# Patient Record
Sex: Female | Born: 1970
Health system: Southern US, Community
[De-identification: ages and names within clinical notes are randomized; demographics above are authoritative.]

## PROBLEM LIST (undated history)

## (undated) DIAGNOSIS — Z789 Other specified health status: Secondary | ICD-10-CM

## (undated) HISTORY — PX: HEMORROIDECTOMY: SUR656

---

## 2011-11-25 ENCOUNTER — Other Ambulatory Visit (HOSPITAL_COMMUNITY)
Admission: RE | Admit: 2011-11-25 | Discharge: 2011-11-25 | Disposition: A | Discharge status: Home / Self Care | Payer: BC Managed Care – PPO | Source: Ambulatory Visit | Attending: Obstetrics and Gynecology | Admitting: Obstetrics and Gynecology

## 2011-11-25 DIAGNOSIS — Z01419 Encounter for gynecological examination (general) (routine) without abnormal findings: Secondary | ICD-10-CM | POA: Insufficient documentation

## 2013-11-08 NOTE — L&D Delivery Note (Signed)
0100: In room to assess patient s/p 2 hours of pushing.  SVE with C/C/+2.  Patient on right side and assisted into supine position with elevation of head.  Patient encouraged to push and notable fetal descent noted. Patient reporting return of shoulder and neck pain, but encouraged to focus on pushing efforts.  FHR remained reassuring throughout pushing and patient delivered as below.   Delivery Note At 1:31 AM a viable female was delivered via VBAC, Spontaneous (Presentation: Right Occiput Anterior).  Shoulders delivered easily and infant with spontaneous cry.  Infant given tactile stimulation, by provider, and placed upon mothers abdomen.  Nurse continued tactile stimuation and infant APGAR: 9, 9; weight 8 lb 5.5 oz (3785 g).  Cord clamped and cut, cord blood collected.  Placenta delivered spontaneously and noted to be intact with 3VC.  Vaginal inspection noted 2nd degree laceration that was repaired.  Patient reporting some lightheadedness and blood pressure noted to be hypotensive.  Patient given IV bolus and PO intake with increase in blood pressure.  Infant in warmer and mother hemodynamically stable prior to provider exit.   Anesthesia: Epidural  Episiotomy: None Lacerations: 2nd degree Suture Repair: 3.0 vicryl Est. Blood Loss (mL): 400  Mom to postpartum.  Baby to Couplet care / Skin to Skin.  Revere Maahs Larita Fife, MSN, CNM 07/21/2014, 4:01 AM

## 2014-01-25 ENCOUNTER — Other Ambulatory Visit (HOSPITAL_COMMUNITY)
Admission: RE | Admit: 2014-01-25 | Discharge: 2014-01-25 | Disposition: A | Discharge status: Home / Self Care | Payer: BC Managed Care – PPO | Source: Ambulatory Visit | Attending: Obstetrics and Gynecology | Admitting: Obstetrics and Gynecology

## 2014-01-25 DIAGNOSIS — Z1151 Encounter for screening for human papillomavirus (HPV): Secondary | ICD-10-CM | POA: Insufficient documentation

## 2014-01-25 DIAGNOSIS — Z01419 Encounter for gynecological examination (general) (routine) without abnormal findings: Secondary | ICD-10-CM | POA: Insufficient documentation

## 2014-01-25 DIAGNOSIS — Z113 Encounter for screening for infections with a predominantly sexual mode of transmission: Secondary | ICD-10-CM | POA: Insufficient documentation

## 2014-07-02 LAB — OB RESULTS CONSOLE GBS: GBS: POSITIVE

## 2014-07-18 ENCOUNTER — Inpatient Hospital Stay (HOSPITAL_COMMUNITY)
Admission: AD | Admit: 2014-07-18 | Payer: BC Managed Care – PPO | Source: Ambulatory Visit | Admitting: Obstetrics and Gynecology

## 2014-07-20 ENCOUNTER — Inpatient Hospital Stay (HOSPITAL_COMMUNITY): Payer: Self-pay | Admitting: Anesthesiology

## 2014-07-20 ENCOUNTER — Encounter (HOSPITAL_COMMUNITY): Payer: Self-pay | Admitting: *Deleted

## 2014-07-20 ENCOUNTER — Inpatient Hospital Stay (HOSPITAL_COMMUNITY)
Admission: AD | Admit: 2014-07-20 | Discharge: 2014-07-23 | DRG: 775 | Disposition: A | Discharge status: Home / Self Care | Payer: Self-pay | Source: Ambulatory Visit | Attending: Obstetrics & Gynecology | Admitting: Obstetrics & Gynecology

## 2014-07-20 ENCOUNTER — Encounter (HOSPITAL_COMMUNITY): Payer: Self-pay | Admitting: Anesthesiology

## 2014-07-20 DIAGNOSIS — Z22330 Carrier of Group B streptococcus: Secondary | ICD-10-CM

## 2014-07-20 DIAGNOSIS — O9989 Other specified diseases and conditions complicating pregnancy, childbirth and the puerperium: Secondary | ICD-10-CM

## 2014-07-20 DIAGNOSIS — O4290 Premature rupture of membranes, unspecified as to length of time between rupture and onset of labor, unspecified weeks of gestation: Secondary | ICD-10-CM | POA: Diagnosis present

## 2014-07-20 DIAGNOSIS — O09529 Supervision of elderly multigravida, unspecified trimester: Secondary | ICD-10-CM | POA: Diagnosis present

## 2014-07-20 DIAGNOSIS — Z98891 History of uterine scar from previous surgery: Secondary | ICD-10-CM

## 2014-07-20 DIAGNOSIS — O99892 Other specified diseases and conditions complicating childbirth: Secondary | ICD-10-CM | POA: Diagnosis present

## 2014-07-20 DIAGNOSIS — O09523 Supervision of elderly multigravida, third trimester: Secondary | ICD-10-CM

## 2014-07-20 DIAGNOSIS — O34219 Maternal care for unspecified type scar from previous cesarean delivery: Secondary | ICD-10-CM | POA: Diagnosis present

## 2014-07-20 HISTORY — DX: Other specified health status: Z78.9

## 2014-07-20 LAB — CBC
HEMATOCRIT: 33.2 % — AB (ref 36.0–46.0)
Hemoglobin: 11.4 g/dL — ABNORMAL LOW (ref 12.0–15.0)
MCH: 33.9 pg (ref 26.0–34.0)
MCHC: 34.3 g/dL (ref 30.0–36.0)
MCV: 98.8 fL (ref 78.0–100.0)
PLATELETS: 135 10*3/uL — AB (ref 150–400)
RBC: 3.36 MIL/uL — ABNORMAL LOW (ref 3.87–5.11)
RDW: 13.2 % (ref 11.5–15.5)
WBC: 10.1 10*3/uL (ref 4.0–10.5)

## 2014-07-20 LAB — RPR

## 2014-07-20 LAB — OB RESULTS CONSOLE HIV ANTIBODY (ROUTINE TESTING): HIV: NONREACTIVE

## 2014-07-20 LAB — TYPE AND SCREEN
ABO/RH(D): B POS
Antibody Screen: NEGATIVE

## 2014-07-20 LAB — ABO/RH: ABO/RH(D): B POS

## 2014-07-20 LAB — POCT FERN TEST: POCT FERN TEST: POSITIVE

## 2014-07-20 LAB — HEPATITIS B SURFACE ANTIGEN: Hepatitis B Surface Ag: NEGATIVE

## 2014-07-20 LAB — RAPID HIV SCREEN (WH-MAU): SUDS RAPID HIV SCREEN: NONREACTIVE

## 2014-07-20 MED ORDER — LACTATED RINGERS IV SOLN: 500.0000 mL | Freq: Once | INTRAVENOUS | Status: DC

## 2014-07-20 MED ORDER — SODIUM BICARBONATE 8.4 % IV SOLN
INTRAVENOUS | Status: DC | PRN
  Administered 2014-07-20: 4 mL via EPIDURAL

## 2014-07-20 MED ORDER — LIDOCAINE HCL (PF) 1 % IJ SOLN
INTRAMUSCULAR | Status: DC | PRN
  Administered 2014-07-20: 10 mL

## 2014-07-20 MED ORDER — FENTANYL 2.5 MCG/ML BUPIVACAINE 1/10 % EPIDURAL INFUSION (WH - ANES)
14.0000 mL/h | INTRAMUSCULAR | Status: DC | PRN
  Administered 2014-07-20 (×2): 14 mL/h via EPIDURAL
  Filled 2014-07-20: qty 125

## 2014-07-20 MED ORDER — OXYTOCIN BOLUS FROM INFUSION
500.0000 mL | INTRAVENOUS | Status: DC
  Administered 2014-07-21: 500 mL via INTRAVENOUS

## 2014-07-20 MED ORDER — PHENYLEPHRINE 40 MCG/ML (10ML) SYRINGE FOR IV PUSH (FOR BLOOD PRESSURE SUPPORT)
80.0000 ug | PREFILLED_SYRINGE | INTRAVENOUS | Status: AC | PRN
  Administered 2014-07-20 (×3): 80 ug via INTRAVENOUS

## 2014-07-20 MED ORDER — FENTANYL 2.5 MCG/ML BUPIVACAINE 1/10 % EPIDURAL INFUSION (WH - ANES)
INTRAMUSCULAR | Status: AC
  Administered 2014-07-20: 14 mL/h via EPIDURAL
  Filled 2014-07-20: qty 125

## 2014-07-20 MED ORDER — DIPHENHYDRAMINE HCL 50 MG/ML IJ SOLN: 12.5000 mg | INTRAMUSCULAR | Status: DC | PRN

## 2014-07-20 MED ORDER — SODIUM CHLORIDE 0.9 % IJ SOLN
INTRAMUSCULAR | Status: AC
  Filled 2014-07-20: qty 100

## 2014-07-20 MED ORDER — OXYTOCIN 40 UNITS IN LACTATED RINGERS INFUSION - SIMPLE MED
1.0000 m[IU]/min | INTRAVENOUS | Status: DC
Start: 2014-07-20 — End: 2014-07-21

## 2014-07-20 MED ORDER — CITRIC ACID-SODIUM CITRATE 334-500 MG/5ML PO SOLN
30.0000 mL | ORAL | Status: DC | PRN
Start: 2014-07-20 — End: 2014-07-21
  Filled 2014-07-20: qty 15

## 2014-07-20 MED ORDER — PENICILLIN G POTASSIUM 5000000 UNITS IJ SOLR
2.5000 10*6.[IU] | INTRAVENOUS | Status: DC
  Administered 2014-07-20 – 2014-07-21 (×5): 2.5 10*6.[IU] via INTRAVENOUS
  Filled 2014-07-20 (×9): qty 2.5

## 2014-07-20 MED ORDER — OXYCODONE-ACETAMINOPHEN 5-325 MG PO TABS: 1.0000 | ORAL_TABLET | ORAL | Status: DC | PRN

## 2014-07-20 MED ORDER — OXYCODONE-ACETAMINOPHEN 5-325 MG PO TABS: 2.0000 | ORAL_TABLET | ORAL | Status: DC | PRN

## 2014-07-20 MED ORDER — LACTATED RINGERS IV SOLN
INTRAVENOUS | Status: DC
  Administered 2014-07-20 (×2): via INTRAUTERINE

## 2014-07-20 MED ORDER — BUPIVACAINE HCL (PF) 0.25 % IJ SOLN
INTRAMUSCULAR | Status: AC
  Filled 2014-07-20: qty 30

## 2014-07-20 MED ORDER — VASOPRESSIN 20 UNIT/ML IJ SOLN
INTRAMUSCULAR | Status: AC
  Filled 2014-07-20: qty 1

## 2014-07-20 MED ORDER — PENICILLIN G POTASSIUM 5000000 UNITS IJ SOLR
5.0000 10*6.[IU] | Freq: Once | INTRAMUSCULAR | Status: AC
  Administered 2014-07-20: 5 10*6.[IU] via INTRAVENOUS
  Filled 2014-07-20: qty 5

## 2014-07-20 MED ORDER — NALBUPHINE HCL 10 MG/ML IJ SOLN
10.0000 mg | INTRAMUSCULAR | Status: DC | PRN
  Administered 2014-07-20: 10 mg via INTRAVENOUS
  Filled 2014-07-20: qty 1

## 2014-07-20 MED ORDER — OXYTOCIN 40 UNITS IN LACTATED RINGERS INFUSION - SIMPLE MED: 62.5000 mL/h | INTRAVENOUS | Status: DC

## 2014-07-20 MED ORDER — EPHEDRINE 5 MG/ML INJ
10.0000 mg | INTRAVENOUS | Status: DC | PRN
  Filled 2014-07-20: qty 2

## 2014-07-20 MED ORDER — PHENYLEPHRINE 40 MCG/ML (10ML) SYRINGE FOR IV PUSH (FOR BLOOD PRESSURE SUPPORT)
80.0000 ug | PREFILLED_SYRINGE | INTRAVENOUS | Status: DC | PRN
  Administered 2014-07-20 (×2): 80 ug via INTRAVENOUS
  Filled 2014-07-20: qty 10
  Filled 2014-07-20: qty 2

## 2014-07-20 MED ORDER — ONDANSETRON HCL 4 MG/2ML IJ SOLN
4.0000 mg | Freq: Four times a day (QID) | INTRAMUSCULAR | Status: DC | PRN
Start: 2014-07-20 — End: 2014-07-21
  Administered 2014-07-20: 4 mg via INTRAVENOUS
  Filled 2014-07-20: qty 2

## 2014-07-20 MED ORDER — ACETAMINOPHEN 10 MG/ML IV SOLN
1000.0000 mg | Freq: Once | INTRAVENOUS | Status: AC
  Administered 2014-07-20: 1000 mg via INTRAVENOUS
  Filled 2014-07-20: qty 100

## 2014-07-20 MED ORDER — PHENYLEPHRINE 40 MCG/ML (10ML) SYRINGE FOR IV PUSH (FOR BLOOD PRESSURE SUPPORT)
PREFILLED_SYRINGE | INTRAVENOUS | Status: AC
  Administered 2014-07-20: 80 ug
  Filled 2014-07-20: qty 10

## 2014-07-20 MED ORDER — TERBUTALINE SULFATE 1 MG/ML IJ SOLN: 0.2500 mg | Freq: Once | INTRAMUSCULAR | Status: AC | PRN

## 2014-07-20 MED ORDER — LACTATED RINGERS IV SOLN
INTRAVENOUS | Status: DC
  Administered 2014-07-20 (×3): via INTRAVENOUS

## 2014-07-20 MED ORDER — LACTATED RINGERS IV SOLN
500.0000 mL | INTRAVENOUS | Status: DC | PRN
  Administered 2014-07-20 – 2014-07-21 (×4): 500 mL via INTRAVENOUS

## 2014-07-20 MED ORDER — LIDOCAINE HCL (PF) 1 % IJ SOLN
30.0000 mL | INTRAMUSCULAR | Status: DC | PRN
Start: 2014-07-20 — End: 2014-07-21
  Filled 2014-07-20: qty 30

## 2014-07-20 MED ORDER — OXYTOCIN 40 UNITS IN LACTATED RINGERS INFUSION - SIMPLE MED
1.0000 m[IU]/min | INTRAVENOUS | Status: DC
  Administered 2014-07-20: 1 m[IU]/min via INTRAVENOUS
  Filled 2014-07-20: qty 1000

## 2014-07-20 MED ORDER — ACETAMINOPHEN 325 MG PO TABS: 650.0000 mg | ORAL_TABLET | ORAL | Status: DC | PRN

## 2014-07-20 MED ORDER — FENTANYL 2.5 MCG/ML BUPIVACAINE 1/10 % EPIDURAL INFUSION (WH - ANES): 14.0000 mL/h | INTRAMUSCULAR | Status: DC | PRN

## 2014-07-20 NOTE — Progress Notes (Signed)
  Subjective: Comfortable with epidural.    Objective: BP 87/54  Pulse 116  Temp(Src) 98.6 F (37 C) (Oral)  Resp 18  Ht  (1.626 m)  Wt 162 lb (73.483 kg)  BMI 27.79 kg/m2  SpO2 96%      FHT: Category 2--moderate variability, persistent mild variables with UCs UC:   regular, every 3 minutes SVE:   7-8, with edematous anterior lip, vtx, +1 IUPC placed without difficulty.  Pitocin at 8 mu/min  Assessment:  Active labor Previous C/S, subsequent VBAC GBS positive Inadequate labor Category 2 FHR, variable decels.  Plan: Amnioinfusion Position change to facilitate rotation/descent. Augment to establish/maintain adequacy.  Nigel Bridgeman CNM 07/20/2014, 3:14 PM

## 2014-07-20 NOTE — Progress Notes (Signed)
  Subjective: Comfortable now with epidural.  Sleeping at present.  Objective: BP 122/69  Pulse 89  Temp(Src) 98.3 F (36.8 C) (Oral)  Resp 18  Ht  (1.626 m)  Wt 162 lb (73.483 kg)  BMI 27.79 kg/m2  SpO2 96%      FHT: Category 2--moderate variability, occasional mild variables. UC:   regular, every 5 minutes SVE:   Dilation: 6 Effacement (%): 80 Station: -1;0 Exam by:: J.Thornton, RN at 1151 Pitocin at 6 mu/min   Assessment:  Progressive labor GBS positive Previous C/S, subsequent VBAC  Plan: Continue current care. Continue augmentation to establish adequacy.  Nigel Bridgeman CNM 07/20/2014, 1:49 PM

## 2014-07-20 NOTE — Progress Notes (Signed)
  Subjective: Crying out with contractions.  Husband at bedside, wants to discuss epidural.  Objective: BP 120/75  Pulse 81  Temp(Src) 98.7 F (37.1 C) (Oral)  Resp 20  Ht  (1.626 m)  Wt 162 lb (73.483 kg)  BMI 27.79 kg/m2      FHT: Category 1 UC:   regular, every 3 minutes SVE:   Dilation: 2.5 Effacement (%): 80 Station: -3 Exam by:: Gerrit Heck, CNM Outlet very tight, patient very uncomfortable with exam Pitocin at 3 mu/min  Assessment:  Early labor Previous C/S, subsequent VBAC, desires VBAC GBS positive  Plan: Reviewed questions about epidural--patient agrees to proceed with placement, with appropriate covering/modesty considerations incorporated.  Nigel Bridgeman CNM 07/20/2014, 9:22 AM

## 2014-07-20 NOTE — Progress Notes (Signed)
Angel Olsen MRN: [id number]  Subjective: -Patient pushing x 1 hour.  Pushing efforts adequate.  Patient denies fatigue.    Objective: BP 131/71  Pulse 69  Temp(Src) 98.2 F (36.8 C) (Oral)  Resp 18  Ht  (1.626 m)  Wt 162 lb (73.483 kg)  BMI 27.79 kg/m2  SpO2 95%   Total I/O In: -  Out: 1000 [Urine:1000] FHT:  135 bpm, Mod Var, + Late Decels, +Accels UC:   Q2-31min, MVUs 205 mmHg SVE:   Dilation: 10 Effacement (%): 100 Station: +1 Exam by:: [name] CNM Membranes:AROM x 24hrs Pitocin: 36mUn/min IUPC in place Foley Catheter in place, draining clear yellow urine O2 via Face Mask Epidural in place, infusing  Assessment:  IUP at 39.3wks Cat II FT GBS Positive 2nd Stage Labor Pushing x 1 hour Asynclitic Presentation-Questionable LOT  Plan: -Continue to pushing efforts  -Continue O2 and IV fluid bolus  -Continue other mgmt as ordered  [name],CNM, MSN 07/20/2014, 11:53 PM

## 2014-07-20 NOTE — Anesthesia Procedure Notes (Signed)

## 2014-07-20 NOTE — Progress Notes (Signed)
  Subjective: Comfortable with epidural.    Objective: BP 120/71  Pulse 87  Temp(Src) 98.6 F (37 C) (Oral)  Resp 18  Ht  (1.626 m)  Wt 162 lb (73.483 kg)  BMI 27.79 kg/m2  SpO2 96%      FHT: Category 2--sporadic late decels and variables, responsive to position change.  Moderate variability. UC:   regular, every 4 minutes SVE:   Dilation: Lip/rim Effacement (%): 100 Station: +1 Exam by:: Tianna Baus cnm Pitocin at 8 mu/min MVUs 170  Assessment:  Progressive labor Category 2 FHR  Plan: Continue augmentation to adequacy. Close observation of FHR.  Nigel Bridgeman CNM 07/20/2014, 5:18 PM

## 2014-07-20 NOTE — MAU Note (Signed)
At 0015 a lot of water came out. Light pink in color. Having some contractions

## 2014-07-20 NOTE — Progress Notes (Signed)
Batoul Limes MRN: [id number]  Subjective: -Patient coping well with contractions.    Objective: BP 122/74  Pulse 76  Temp(Src) 98.7 F (37.1 C) (Oral)  Resp 18  Ht  (1.626 m)  Wt 162 lb 12.8 oz (73.846 kg)  BMI 27.93 kg/m2     FHT:  135 bpm, Mod Var, -Decels, +Accels UC:   Q 2-70min, palpates mild SVE:   Dilation: FT Effacement (%): 50 Station: -3 Exam by:: Sabas Sous, CNM Membranes:SROM x 6hrs Pitocin:25mUn/min BS [place]: Vertex confirmed  Assessment:  IUP at 39.2wks Cat I FT  SROM Pitocin Augmentation  Plan: -Discussed fetal position  -Informed that physician will assume care this am -No questions or concerns -Continue other mgmt as ordered  [facility], Orell Hurtado LYNN,CNM, MSN 07/20/2014, 6:29 AM

## 2014-07-20 NOTE — Anesthesia Preprocedure Evaluation (Signed)
Anesthesia Evaluation  Patient identified by MRN, date of birth, ID band Patient awake    Reviewed: Allergy & Precautions, H&P , Patient's Chart, lab work & pertinent test results  Airway Mallampati: II TM Distance: >3 FB Neck ROM: full    Dental  (+) Teeth Intact   Pulmonary  breath sounds clear to auscultation        Cardiovascular Rhythm:regular Rate:Normal     Neuro/Psych    GI/Hepatic   Endo/Other    Renal/GU      Musculoskeletal   Abdominal   Peds  Hematology   Anesthesia Other Findings    TOLAC, successful VB after CS already   Reproductive/Obstetrics (+) Pregnancy                           Anesthesia Physical Anesthesia Plan  ASA: II  Anesthesia Plan: Epidural   Post-op Pain Management:    Induction:   Airway Management Planned:   Additional Equipment:   Intra-op Plan:   Post-operative Plan:   Informed Consent: I have reviewed the patients History and Physical, chart, labs and discussed the procedure including the risks, benefits and alternatives for the proposed anesthesia with the patient or authorized representative who has indicated his/her understanding and acceptance.   Dental Advisory Given  Plan Discussed with:   Anesthesia Plan Comments: (Labs checked- platelets confirmed with RN in room. Fetal heart tracing, per RN, reported to be stable enough for sitting procedure. Discussed epidural, and patient consents to the procedure:  included risk of possible headache,backache, failed block, allergic reaction, and nerve injury. This patient was asked if she had any questions or concerns before the procedure started.)        Anesthesia Quick Evaluation

## 2014-07-20 NOTE — Progress Notes (Signed)
Raffaela Ladley MRN: [id number]  Subjective: -Nurse call stating patient experiencing increased abdominal pain and discomfort, despite 2 PCA doses.  Patient breathing through contractions.    Objective: BP 114/79  Pulse 84  Temp(Src) 98.8 F (37.1 C) (Oral)  Resp 18  Ht  (1.626 m)  Wt 162 lb (73.483 kg)  BMI 27.79 kg/m2  SpO2 95%     FHT:  135 bpm, Mod Var, -Decels, +Accels UC:Q2-24min, palpates moderate, MVUs   SVE:   Lip/Rim, -2 Membranes:AROM x 21hrs Pitocin:78mUn/min Foley Catheter in Place Epidural in place, infusing  Assessment:  IUP at 39.2wks Cat I FT  Negative change in cervical exam  Plan: -Dr. Audree Camel consulted and patient case discussed -In room to discuss options with patient including c/s -Patient requesting time to discuss options with husband -Continue other mgmt as ordered  Laren Boom, MSN 07/20/2014, 10:39 PM

## 2014-07-20 NOTE — Progress Notes (Signed)
Jackie Russman MRN: [id number]  Subjective: -In room for late deceleration. Patient tearful and reporting intense pain in left shoulder and neck.    Objective: BP 100/53  Pulse 75  Temp(Src) 98.6 F (37 C) (Oral)  Resp 18  Ht  (1.626 m)  Wt 162 lb (73.483 kg)  BMI 27.79 kg/m2  SpO2 95%     FHT: 150 bpm, Mod Var, + Late Decels, +Accels UC: Q , MVUs   SVE:   Dilation: 10 Effacement (%): 100 Station: -1 Exam by:: V.Latham, CNM Membranes: SROM x 18hrs Pitocin:80mUn/min Epidural in place Ice pack on left shoulder  Assessment:  IUP at 39.2wks Cat II FT GBS Positive Referred Pain  Plan: -Anesthesia contacted regarding shoulder pain, instructed to turn off epidural and IV tylenol -Position change to assist with shoulder pain and Cat II FT -O2 and IV bolus  -Continue other mgmt as ordered -Provider to remain at bedside  Laren Boom, MSN 07/20/2014, 7:47 PM

## 2014-07-20 NOTE — Progress Notes (Signed)
Angel Olsen MRN: [id number]  Subjective: -Patient reports resolution of neck/shoulder pain with ice pack and IV tylenol.  Patient now feeling discomfort with contractions.   Objective: BP 109/59  Pulse 97  Temp(Src) 98.8 F (37.1 C) (Oral)  Resp 20  Ht  (1.626 m)  Wt 162 lb (73.483 kg)  BMI 27.79 kg/m2  SpO2 95%     FHT: 140 bpm, Mod Var, -Decels, +Accels UC:   Q2-41min, palpates moderate, MVUs adequate SVE:   Dilation: 10 Effacement (%): 100 Station: +1 Exam by:: Adreena Willits CNM Membranes: AROM x 20hrs Pitocin:44mUn/min Epidural in place, infusion paused  Assessment:  IUP at 39.2wks Cat I FT  2nd Stage Labor Inadequate Pushing Efforts  Plan: -Pushing efforts inadequate -Patient placed in high fowlers to labor down -Will continue to monitor -Continue other mgmt as ordered -Anticipate SVD  Angel Olsen LYNN,CNM, MSN 07/20/2014, 8:36 PM

## 2014-07-20 NOTE — Progress Notes (Addendum)
  Subjective: Breathing with UCs.  Husband and mother at bedside.  Received Nubain at 0722--declining epidural due to desire to avoid a female provider.  No epidural with previous vaginal delivery.  Husband reports 1st labor progressed to 7 cm, and he felt C/S was done due to "greed" on the part of the doctors/hospital in [place].  Patient listed under two names in EPIC: Kyren Alafaran (DOB 01-25-1971) Retta Marciano (DOB [birth year])  I found prenatal records on this patient, entered under the following name/birthdate in EPIC: Tanyia, Grabbe DOB [birth year] Address:  [address] [place], [place], [place] [address] Different SSN # (but may be dummy numbers) due to patient not being American citizen. Different insurance information Same phone numbers and email Same emergency contact/spouse  Same patient listed in EPIC asMaya, Arcand DOB 1971/05/06 [address] [place], [place] [address]   Objective: BP 120/75  Pulse 81  Temp(Src) 98.7 F (37.1 C) (Oral)  Resp 20  Ht  (1.626 m)  Wt 162 lb (73.483 kg)  BMI 27.79 kg/m2      FHT: Category 1 UC:   regular, every 2-3 minutes SVE:  Deferred at present--last exam at 0557, 1 cm, 40%, vtx, -3  Pitocin at 3 mu/min  Assessment:  Latent/early labor PROM at term x 7 hrs. GBS positive Previous C/S, subsequent VBAC, desires VBAC  Plan: Continue current care. Discussed issue of epidural and desire to avoid a female provider--husband advises they may consider epidural depending on progress in labor and patient tolerance of pain.  Support to patient for any decision she prefers. Will confirm correct demographic data on patient. Will inform medical records and MAU of situation.  Patient and husband confirm information on prenatal record is correct: Dariyah Sigl DOB [birth year] [address] [place], [place] [address]  Nigel Bridgeman CNM 07/20/2014, 8:16 AM  Addendum:  PN Labs from [facility] Records: B+, neg antibody, RPR NR, HIV NR, Hgb  electrophoresis negative, TSH WNL, GDM screen WNL, Hgb 10.9 at NOB Rubella and HBsAG not listed.  HBsAG NR per admission labs. Rubella screen in process.  Nigel Bridgeman, CNM 07/20/14 8:30a

## 2014-07-20 NOTE — H&P (Signed)
Angel Olsen is a 43 y.o. female presenting for SROM.  Patient states that her water broke at 0015.  Patient states that she was having some mild irregular contractions earlier in the day, but none prior to SROM. Patient reports active fetus and denies VB and ctx.     Maternal Medical History:  Reason for admission: Rupture of membranes.   Contractions: Onset was 1-2 hours ago.   Frequency: rare.   Perceived severity is mild.    Fetal activity: Perceived fetal activity is normal.   Last perceived fetal movement was within the past hour.    Prenatal complications: no prenatal complications Prenatal Complications - Diabetes: none.   Patient with history of cesarean section in 2007 Successful VBAC in 2011 Previous pregnancies and deliveries occurred in [place] OB History   Grav Para Term Preterm Abortions TAB SAB Ect Mult Living   Past Medical History  Diagnosis Date  . Medical history non-contributory    Past Surgical History  Procedure Laterality Date  . Cesarean section    . Hemorroidectomy     Family History: family history is not on file. Social History:  reports that she has never smoked. She does not have any smokeless tobacco history on file. She reports that she does not drink alcohol or use illicit drugs.   Prenatal Transfer Tool  Maternal Diabetes: No Genetic Screening: UNKNOWN Maternal Ultrasounds/Referrals: Normal Fetal Ultrasounds or other Referrals:  None Maternal Substance Abuse:  No Significant Maternal Medications:  None Significant Maternal Lab Results:  Lab values include: Group B Strep positive Other Comments:  None  Review of Systems  All other systems reviewed and are negative.     Blood pressure 129/69, pulse 82, temperature 98.7 F (37.1 C), resp. rate 18, height  (1.626 m), weight 162 lb 12.8 oz (73.846 kg). Maternal Exam:  Uterine Assessment: Contraction strength is mild.  Abdomen: Patient reports no abdominal  tenderness. Introitus: Ferning test: positive.  Nitrazine test: not done. Amniotic fluid character: clear.  Cervix: Cervix evaluated by digital exam.     Fetal Exam Fetal Monitor Review: Baseline rate: 135.  Variability: moderate (6-25 bpm).   Pattern: no decelerations and accelerations present.    Fetal State Assessment: Category I - tracings are normal.     Physical Exam  Vitals reviewed. Constitutional: She is oriented to person, place, and time. She appears well-developed and well-nourished.  Cardiovascular: Normal rate.   Respiratory: Effort normal.  GI: Soft.  Neurological: She is alert and oriented to person, place, and time.  Skin: Skin is warm and dry.    Prenatal labs: ABO, Rh:  Pending Antibody:  Pending Rubella:  Pending RPR:   Pending HBsAg:   Pending HIV:   Pending GBS:   Positive, per pt  Assessment/Plan: IUP at 39.2wks Cat I FT SROM GBS Positive Inadequate Contractions   Admit to [facility] per consult with Dr. Kathie Rhodes. [place] Routine Labor and Delivery Orders Start PCN for GBS prophylaxis Start Pitocin for Augmentation Okay for Epidural, when desired Anticipate SVD    Henley Blyth LYNN CNM, MSN 07/20/2014, 2:46 AM

## 2014-07-20 NOTE — Progress Notes (Signed)
  Subjective: Feeling some pressure, nausea, and left shoulder pain, but no urge to push.  Objective: BP 120/71  Pulse 87  Temp(Src) 98.6 F (37 C) (Oral)  Resp 18  Ht  (1.626 m)  Wt 162 lb (73.483 kg)  BMI 27.79 kg/m2  SpO2 96%      FHT: Category 2--moderate variability, variable decels UC:   regular, every 4 minutes SVE:   Dilation: 10 Effacement (%): 100 Station: -1 Exam by:: V.Kishaun Erekson, CNM Anterior lip reduced with UC. Pitocin at 9 mu/min Ice pak applied to right shoulder with benefit. Zofran IV given.  Assessment:  2nd stage labor Variables  Plan: Position in semi-fowler's for descent. Repeat amnioinfusion Close observation of FHR status.  Nigel Bridgeman CNM 07/20/2014, 6:09 PM

## 2014-07-20 NOTE — Progress Notes (Signed)
Pt comfortable with epidural BP 141/105  Pulse 120  Temp(Src) 98.8 F (37.1 C) (Oral)  Resp 18  Ht  (1.626 m)  Wt 73.483 kg (162 lb)  BMI 27.79 kg/m2  SpO2 95% Category 1 tracing toco q 2-4 minutes cx anterior lip that is reducible and plus one station Pt declined interpreter I offered a CS,  PT complete for five hours with adequate contractions.  She declined.  Will push for two hours. If no progress then will consider CS PIH blood pressure controlled.  Will monitor Husband served as her interpreter

## 2014-07-21 ENCOUNTER — Encounter (HOSPITAL_COMMUNITY): Payer: Self-pay

## 2014-07-21 DIAGNOSIS — O34219 Maternal care for unspecified type scar from previous cesarean delivery: Secondary | ICD-10-CM | POA: Diagnosis not present

## 2014-07-21 LAB — CBC
HEMATOCRIT: 31.4 % — AB (ref 36.0–46.0)
Hemoglobin: 10.6 g/dL — ABNORMAL LOW (ref 12.0–15.0)
MCH: 33.2 pg (ref 26.0–34.0)
MCHC: 33.8 g/dL (ref 30.0–36.0)
MCV: 98.4 fL (ref 78.0–100.0)
Platelets: 133 10*3/uL — ABNORMAL LOW (ref 150–400)
RBC: 3.19 MIL/uL — ABNORMAL LOW (ref 3.87–5.11)
RDW: 13.3 % (ref 11.5–15.5)
WBC: 18 10*3/uL — AB (ref 4.0–10.5)

## 2014-07-21 MED ORDER — LANOLIN HYDROUS EX OINT
TOPICAL_OINTMENT | CUTANEOUS | Status: DC | PRN
Start: 2014-07-21 — End: 2014-07-23

## 2014-07-21 MED ORDER — ZOLPIDEM TARTRATE 5 MG PO TABS: 5.0000 mg | ORAL_TABLET | Freq: Every evening | ORAL | Status: DC | PRN

## 2014-07-21 MED ORDER — SENNOSIDES-DOCUSATE SODIUM 8.6-50 MG PO TABS
2.0000 | ORAL_TABLET | ORAL | Status: DC
  Administered 2014-07-21 – 2014-07-22 (×2): 2 via ORAL
  Filled 2014-07-21 (×2): qty 2

## 2014-07-21 MED ORDER — BENZOCAINE-MENTHOL 20-0.5 % EX AERO
1.0000 "application " | INHALATION_SPRAY | CUTANEOUS | Status: DC | PRN
  Filled 2014-07-21: qty 56

## 2014-07-21 MED ORDER — WITCH HAZEL-GLYCERIN EX PADS
1.0000 | MEDICATED_PAD | CUTANEOUS | Status: DC | PRN
Start: 2014-07-21 — End: 2014-07-23

## 2014-07-21 MED ORDER — OXYCODONE-ACETAMINOPHEN 5-325 MG PO TABS: 1.0000 | ORAL_TABLET | ORAL | Status: DC | PRN

## 2014-07-21 MED ORDER — ONDANSETRON HCL 4 MG/2ML IJ SOLN: 4.0000 mg | INTRAMUSCULAR | Status: DC | PRN

## 2014-07-21 MED ORDER — PRENATAL MULTIVITAMIN CH
1.0000 | ORAL_TABLET | Freq: Every day | ORAL | Status: DC
  Administered 2014-07-21 – 2014-07-22 (×2): 1 via ORAL
  Filled 2014-07-21 (×2): qty 1

## 2014-07-21 MED ORDER — SIMETHICONE 80 MG PO CHEW
80.0000 mg | CHEWABLE_TABLET | ORAL | Status: DC | PRN
Start: 2014-07-21 — End: 2014-07-23

## 2014-07-21 MED ORDER — ONDANSETRON HCL 4 MG PO TABS: 4.0000 mg | ORAL_TABLET | ORAL | Status: DC | PRN

## 2014-07-21 MED ORDER — IBUPROFEN 600 MG PO TABS
600.0000 mg | ORAL_TABLET | Freq: Four times a day (QID) | ORAL | Status: DC
  Administered 2014-07-21 – 2014-07-23 (×9): 600 mg via ORAL
  Filled 2014-07-21 (×9): qty 1

## 2014-07-21 MED ORDER — OXYCODONE-ACETAMINOPHEN 5-325 MG PO TABS: 2.0000 | ORAL_TABLET | ORAL | Status: DC | PRN

## 2014-07-21 MED ORDER — DIBUCAINE 1 % RE OINT: 1.0000 "application " | TOPICAL_OINTMENT | RECTAL | Status: DC | PRN

## 2014-07-21 MED ORDER — TETANUS-DIPHTH-ACELL PERTUSSIS 5-2.5-18.5 LF-MCG/0.5 IM SUSP: 0.5000 mL | Freq: Once | INTRAMUSCULAR | Status: DC

## 2014-07-21 MED ORDER — DIPHENHYDRAMINE HCL 25 MG PO CAPS: 25.0000 mg | ORAL_CAPSULE | Freq: Four times a day (QID) | ORAL | Status: DC | PRN

## 2014-07-21 NOTE — Anesthesia Postprocedure Evaluation (Signed)
Anesthesia Post Note  Patient: Angel Olsen  Procedure(s) Performed: * No procedures listed *  Anesthesia type: Epidural  Patient location: Mother/Baby  Post pain: Pain level controlled  Post assessment: Post-op Vital signs reviewed  Last Vitals:  Filed Vitals:   07/21/14 0600  BP: 118/55  Pulse: 64  Temp: 36.9 C  Resp: 18    Post vital signs: Reviewed  Level of consciousness:alert  Complications: No apparent anesthesia complications

## 2014-07-21 NOTE — Lactation Note (Signed)
This note was copied from the chart of Angel Olsen. Lactation Consultation Note  P3, Ex BF, Baby cueing in crib.  Assisted in unwrapping baby. Mother states she knows how to hand express and placed him in cradle hold. Mother pulling back on her breast "so he can breathe". Explained this pulls this nipple out of his mouth. Reviewed how to bring his shouders and chin in more and how then his nose rests on breast and he can breathe. Reviewed cluster feeding. Mom made aware of O/P services, breastfeeding support groups, community resources, and our phone # for post-discharge questions.    Patient Name: Angel Olsen [id number] Date: 07/21/2014 Reason for consult: Initial assessment   Maternal Data Has patient been taught Hand Expression?: Yes Does the patient have breastfeeding experience prior to this delivery?: Yes  Feeding Feeding Type: Breast Fed  LATCH Score/Interventions Latch: Grasps breast easily, tongue down, lips flanged, rhythmical sucking.  Audible Swallowing: A few with stimulation  Type of Nipple: Everted at rest and after stimulation  Comfort (Breast/Nipple): Soft / non-tender     Hold (Positioning): No assistance needed to correctly position infant at breast.  LATCH Score: 9  Lactation Tools Discussed/Used     Consult Status Consult Status: Follow-up Date: 07/22/14 Follow-up type: In-patient    Dahlia Byes [facility] 07/21/2014, 2:53 PM

## 2014-07-21 NOTE — Progress Notes (Signed)
Subjective: Postpartum Day 0: Vaginal delivery, 2nd degree laceration Patient up ad lib, reports no syncope or dizziness. Feeding:  Breast and bottle Contraceptive plan:  Undecided  Objective: Vital signs in last 24 hours: Temp:  [97.9 F (36.6 C)-98.8 F (37.1 C)] 98.4 F (36.9 C) (09/13 0600) Pulse Rate:  [64-120] 64 (09/13 0600) Resp:  [16-20] 18 (09/13 0600) BP: (87-154)/(40-111) 118/55 mmHg (09/13 0600) SpO2:  [92 %-100 %] 97 % (09/13 0600)  Physical Exam:  General: alert Lochia: appropriate Uterine Fundus: firm Perineum: healing well DVT Evaluation: No evidence of DVT seen on physical exam. Negative Homan's sign.    Recent Labs  07/20/14 0356 07/21/14 0757  HGB 11.4* 10.6*  HCT 33.2* 31.4*    Assessment/Plan: Status post vaginal delivery day 0. Stable Continue current care. [facility] will see tomorrow.    Nyra Capes 07/21/2014, 8:44 AM

## 2014-07-22 NOTE — Progress Notes (Signed)
Ur chart review completed.  

## 2014-07-22 NOTE — Progress Notes (Signed)
Post Partum Day 1 s/p SVD Subjective: no complaints, up ad lib, voiding and tolerating PO  Objective: Blood pressure 118/60, pulse 78, temperature 98.4 F (36.9 C), temperature source Oral, resp. rate 20, height  (1.626 m), weight 73.483 kg (162 lb), SpO2 98.00%, unknown if currently breastfeeding.  Physical Exam:  General: alert and cooperative Lochia: appropriate Uterine Fundus: firm Incision: NA DVT Evaluation: No evidence of DVT seen on physical exam.   Recent Labs  07/20/14 0356 07/21/14 0757  HGB 11.4* 10.6*  HCT 33.2* 31.4*    Assessment/Plan: Plan for discharge tomorrow, Breastfeeding and Circumcision prior to discharge   LOS: 2 days   Angel Olsen J. 07/22/2014, 12:50 PM

## 2014-07-23 LAB — RUBELLA SCREEN: Rubella: 24 Index — ABNORMAL HIGH (ref <0.90)

## 2014-07-23 MED ORDER — IBUPROFEN 600 MG PO TABS: 600.0000 mg | ORAL_TABLET | Freq: Four times a day (QID) | ORAL | Status: DC | PRN

## 2014-07-23 NOTE — Discharge Summary (Signed)
Obstetric Discharge Summary Reason for Admission: onset of labor Prenatal Procedures: none Intrapartum Procedures: spontaneous vaginal delivery Postpartum Procedures: none Complications-Operative and Postpartum: none Hemoglobin  Date Value Ref Range Status  07/21/2014 10.6* 12.0 - 15.0 g/dL Final     HCT  Date Value Ref Range Status  07/21/2014 31.4* 36.0 - 46.0 % Final    Physical Exam:  General: alert and cooperative Lochia: appropriate Uterine Fundus: firm Incision: NA DVT Evaluation: No evidence of DVT seen on physical exam.  Discharge Diagnoses: Term Pregnancy-delivered  Discharge Information: Date: 07/23/2014 Activity: pelvic rest Diet: routine Medications: PNV and Ibuprofen Condition: stable Instructions: refer to practice specific booklet Discharge to: home Follow-up Information   Follow up with Myna Hidalgo, M, DO On 07/27/2014. (10:00am)    Specialty:  Obstetrics and Gynecology   Contact information:   [address] [place] [address] [phone]       Follow up with Jessee Avers., MD. Schedule an appointment as soon as possible for a visit in 6 weeks. (postpartum visit )    Specialty:  Obstetrics and Gynecology   Contact information:   [address] [place] [address] [phone]       Newborn Data: Live born female  Birth Weight: 8 lb 5.5 oz (3785 g) APGAR: 9, 9  Home with mother.  Angel Gonsalves J. 07/23/2014, 8:27 AM

## 2014-07-29 ENCOUNTER — Encounter (HOSPITAL_COMMUNITY): Payer: Self-pay

## 2014-09-09 ENCOUNTER — Encounter (HOSPITAL_COMMUNITY): Payer: Self-pay

## 2016-05-27 DIAGNOSIS — R011 Cardiac murmur, unspecified: Secondary | ICD-10-CM | POA: Diagnosis not present

## 2016-05-27 DIAGNOSIS — M25562 Pain in left knee: Secondary | ICD-10-CM | POA: Diagnosis not present

## 2016-05-27 DIAGNOSIS — Z1231 Encounter for screening mammogram for malignant neoplasm of breast: Secondary | ICD-10-CM | POA: Diagnosis not present

## 2016-07-15 DIAGNOSIS — R42 Dizziness and giddiness: Secondary | ICD-10-CM | POA: Diagnosis not present

## 2016-07-15 DIAGNOSIS — R011 Cardiac murmur, unspecified: Secondary | ICD-10-CM | POA: Diagnosis not present

## 2016-07-15 DIAGNOSIS — R079 Chest pain, unspecified: Secondary | ICD-10-CM | POA: Insufficient documentation

## 2016-07-16 DIAGNOSIS — R011 Cardiac murmur, unspecified: Secondary | ICD-10-CM | POA: Diagnosis not present

## 2016-07-16 DIAGNOSIS — R002 Palpitations: Secondary | ICD-10-CM | POA: Insufficient documentation

## 2016-07-16 DIAGNOSIS — R079 Chest pain, unspecified: Secondary | ICD-10-CM | POA: Diagnosis not present

## 2016-07-16 DIAGNOSIS — R42 Dizziness and giddiness: Secondary | ICD-10-CM | POA: Diagnosis not present

## 2017-01-10 DIAGNOSIS — R079 Chest pain, unspecified: Secondary | ICD-10-CM | POA: Diagnosis not present

## 2017-01-11 ENCOUNTER — Other Ambulatory Visit: Payer: Self-pay | Admitting: Physician Assistant

## 2017-01-11 DIAGNOSIS — Z1231 Encounter for screening mammogram for malignant neoplasm of breast: Secondary | ICD-10-CM

## 2017-02-24 DIAGNOSIS — R079 Chest pain, unspecified: Secondary | ICD-10-CM | POA: Diagnosis not present

## 2017-02-24 DIAGNOSIS — R42 Dizziness and giddiness: Secondary | ICD-10-CM | POA: Diagnosis not present

## 2017-02-24 DIAGNOSIS — N644 Mastodynia: Secondary | ICD-10-CM | POA: Diagnosis not present

## 2017-02-25 ENCOUNTER — Other Ambulatory Visit: Payer: Self-pay | Admitting: Family Medicine

## 2017-02-25 DIAGNOSIS — N644 Mastodynia: Secondary | ICD-10-CM

## 2017-02-28 ENCOUNTER — Other Ambulatory Visit: Payer: Self-pay | Admitting: Family Medicine

## 2017-02-28 DIAGNOSIS — R079 Chest pain, unspecified: Secondary | ICD-10-CM

## 2017-03-03 ENCOUNTER — Ambulatory Visit
Admission: RE | Admit: 2017-03-03 | Discharge: 2017-03-03 | Disposition: A | Discharge status: Home / Self Care | Payer: BLUE CROSS/BLUE SHIELD | Source: Ambulatory Visit | Attending: Family Medicine | Admitting: Family Medicine

## 2017-03-03 ENCOUNTER — Other Ambulatory Visit: Payer: Self-pay | Admitting: Family Medicine

## 2017-03-03 DIAGNOSIS — R928 Other abnormal and inconclusive findings on diagnostic imaging of breast: Secondary | ICD-10-CM | POA: Diagnosis not present

## 2017-03-03 DIAGNOSIS — N6489 Other specified disorders of breast: Secondary | ICD-10-CM | POA: Diagnosis not present

## 2017-03-03 DIAGNOSIS — N644 Mastodynia: Secondary | ICD-10-CM

## 2017-03-03 DIAGNOSIS — R911 Solitary pulmonary nodule: Secondary | ICD-10-CM

## 2017-03-03 DIAGNOSIS — R079 Chest pain, unspecified: Secondary | ICD-10-CM | POA: Diagnosis not present

## 2017-03-16 ENCOUNTER — Other Ambulatory Visit (HOSPITAL_COMMUNITY): Payer: Self-pay

## 2017-03-17 ENCOUNTER — Telehealth (HOSPITAL_COMMUNITY): Payer: Self-pay | Admitting: Family Medicine

## 2017-03-17 NOTE — Telephone Encounter (Signed)
Called pt and spoke with her husband in regards to scheduling a regular echo. I informed him that her regular echo could be left on 5/14 and her stress echo to 5/29. He voiced that he did not have his calendar in front of him and did not call back.

## 2017-03-21 ENCOUNTER — Other Ambulatory Visit (HOSPITAL_COMMUNITY): Payer: Self-pay

## 2017-03-21 ENCOUNTER — Other Ambulatory Visit: Payer: Self-pay | Admitting: Family Medicine

## 2017-03-21 DIAGNOSIS — R011 Cardiac murmur, unspecified: Secondary | ICD-10-CM

## 2017-03-21 DIAGNOSIS — R079 Chest pain, unspecified: Secondary | ICD-10-CM

## 2017-04-01 ENCOUNTER — Ambulatory Visit (HOSPITAL_COMMUNITY): Payer: BLUE CROSS/BLUE SHIELD | Attending: Cardiology

## 2017-04-01 ENCOUNTER — Other Ambulatory Visit: Payer: Self-pay

## 2017-04-01 DIAGNOSIS — R079 Chest pain, unspecified: Secondary | ICD-10-CM | POA: Insufficient documentation

## 2017-04-01 DIAGNOSIS — R011 Cardiac murmur, unspecified: Secondary | ICD-10-CM

## 2017-05-05 ENCOUNTER — Other Ambulatory Visit (HOSPITAL_COMMUNITY): Payer: BLUE CROSS/BLUE SHIELD

## 2018-06-07 IMAGING — CT CT CHEST W/O CM
3 of 4 series · 17 of 30 positions shown, 19 images · non-contrast
Comparison: None ; correlation chest radiograph 02/24/2017

CLINICAL DATA: Abnormal chest x-ray question lung nodule, some LEFT
chest pain, dizziness, nausea

EXAM:
CT CHEST WITHOUT CONTRAST
TECHNIQUE: Multidetector CT imaging of the chest was performed following the
standard protocol without IV contrast. Sagittal and coronal MPR
images reconstructed from axial data set.

[Series 3: chest w/o · axial · non-contrast · 0.70mm/px · z∈[-214,-14]mm · 6 of 114 slices shown]
[im 17/114  lung]
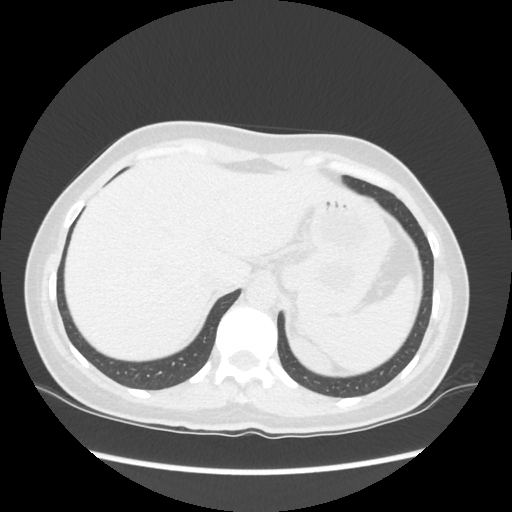
[im 33/114  lung]
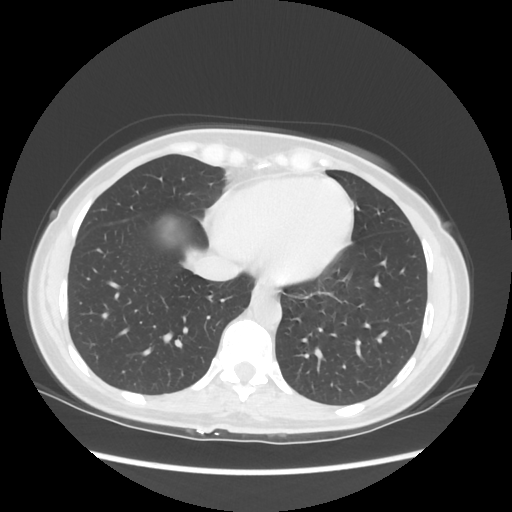
[im 49/114  lung]
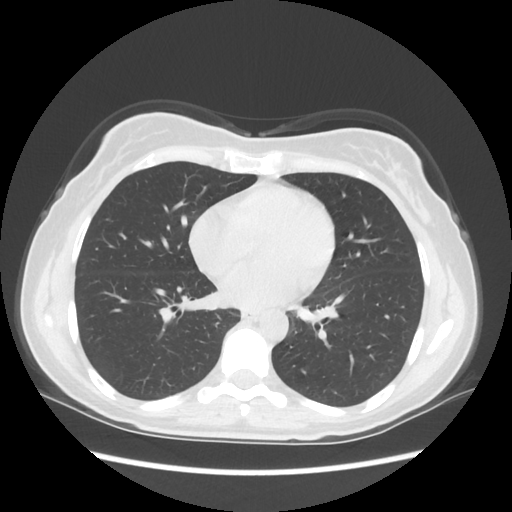
[im 65/114  lung]
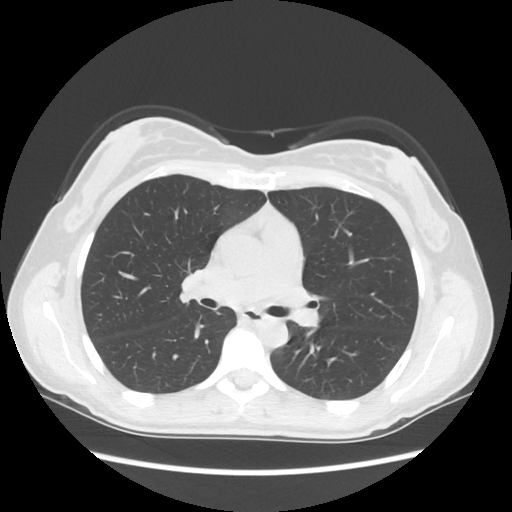
[im 81/114  lung]
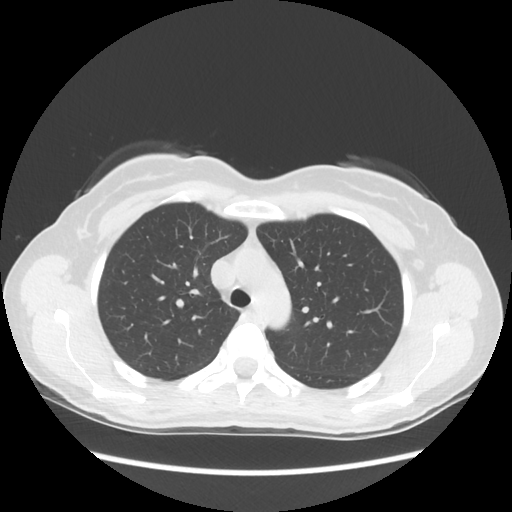
[im 97/114  lung]
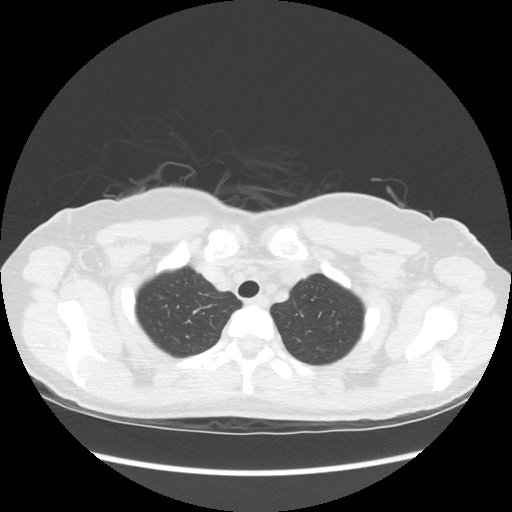

[Series 4: lung windows · axial · 0.70mm/px · z∈[-219,-9]mm · 7 of 114 slices shown, 9 images]
[im 15/114  mediastinal]
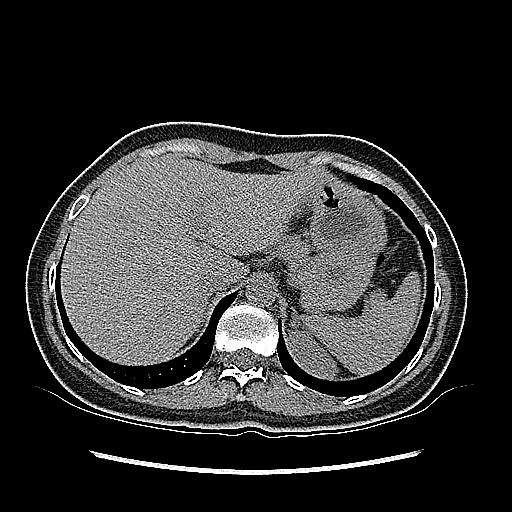
[im 15/114  lung]
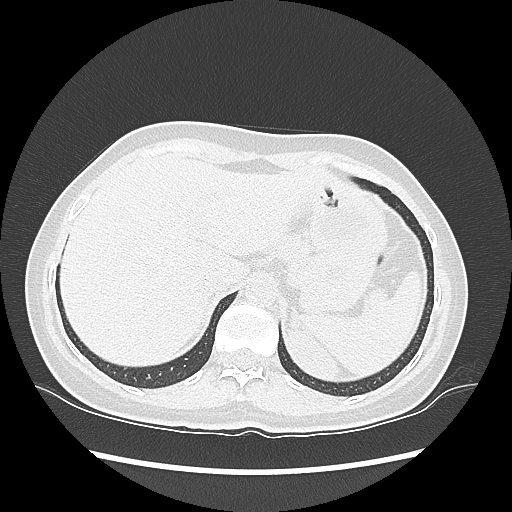
[im 29/114  lung]
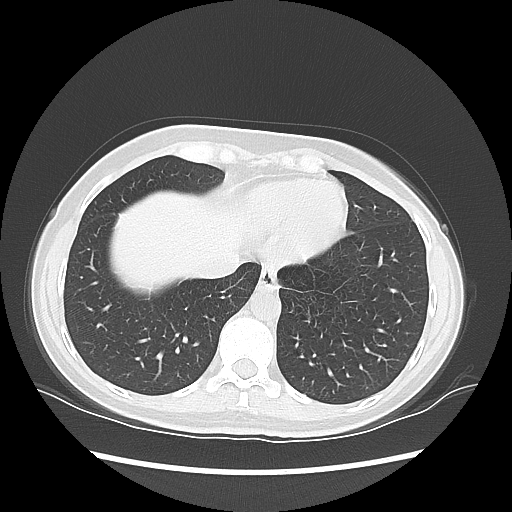
[im 43/114  lung]
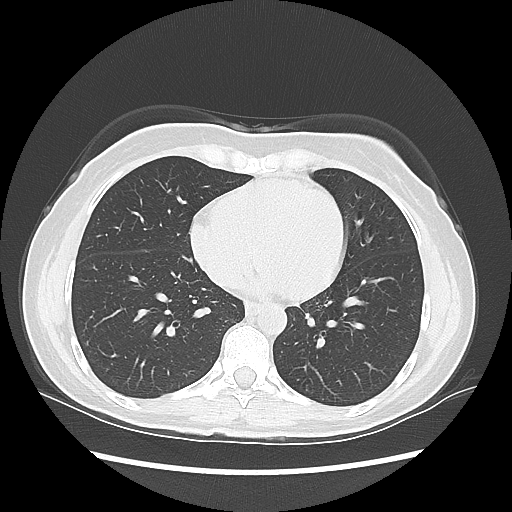
[im 57/114  lung]
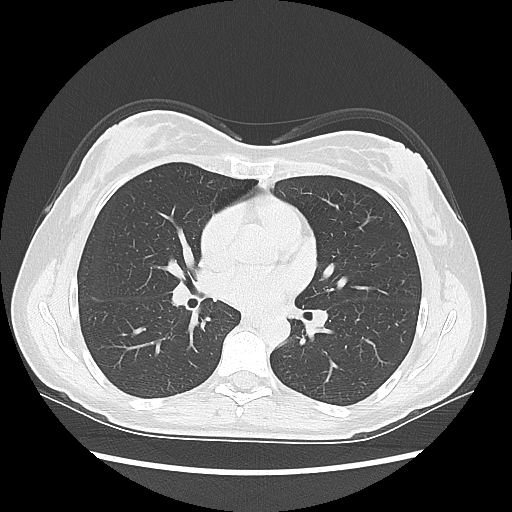
[im 71/114  mediastinal]
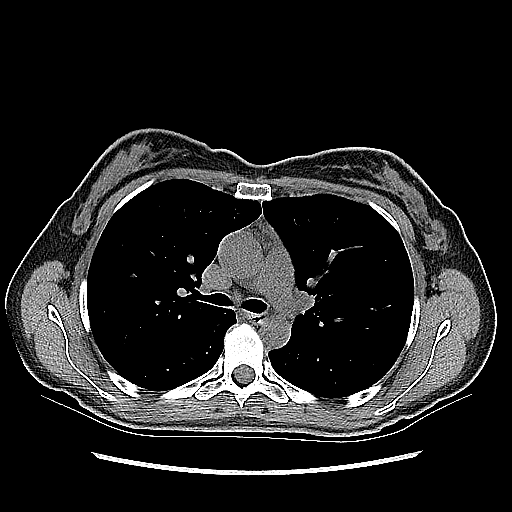
[im 71/114  lung]
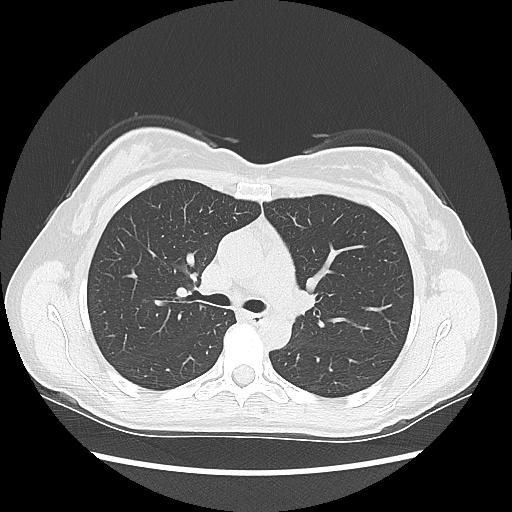
[im 85/114  lung]
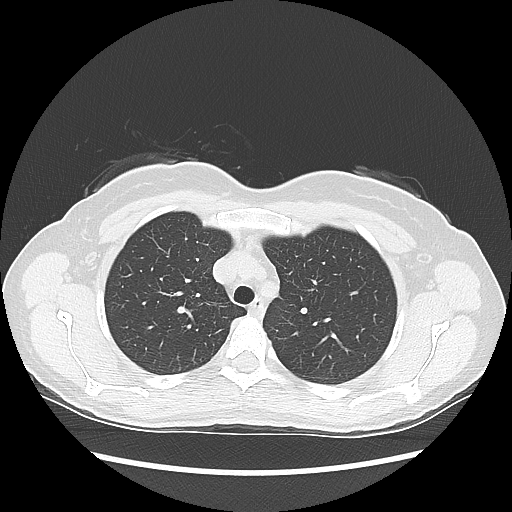
[im 99/114  lung]
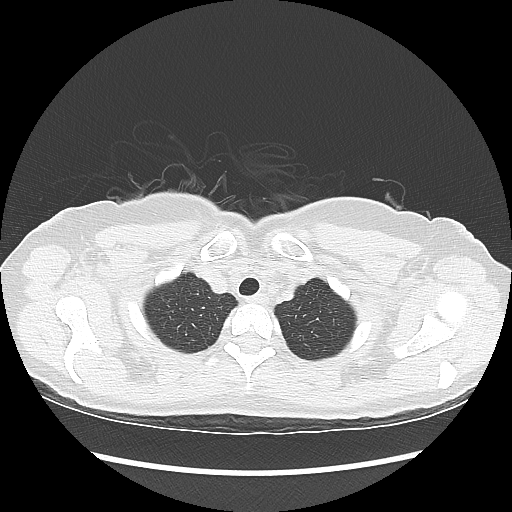

[Series 602: sagittal body · sagittal · 0.70mm/px · 4 of 145 slices shown]
[im 15/145  mediastinal]
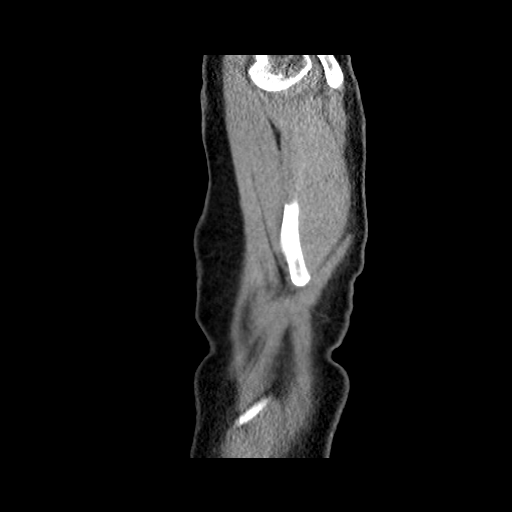
[im 29/145  mediastinal]
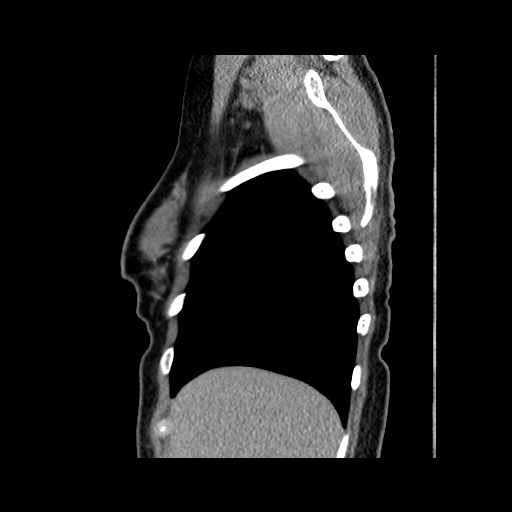
[im 44/145  mediastinal]
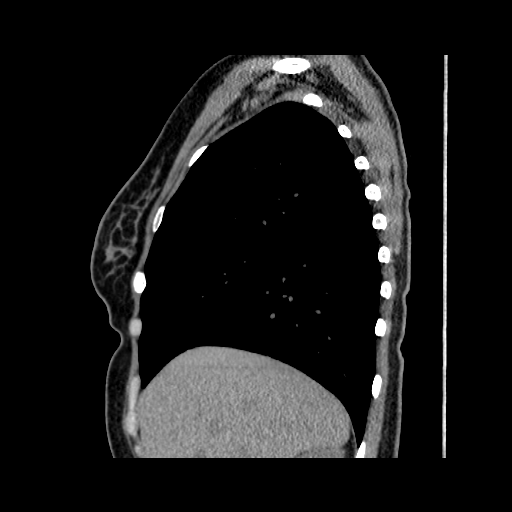
[im 58/145  mediastinal]
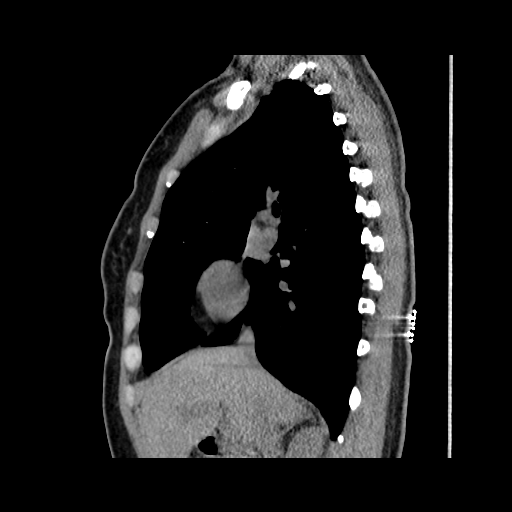

[17 of 30 positions shown; findings below may reference images not displayed]

FINDINGS: Cardiovascular: Aorta normal caliber.  No pericardial effusion.

Mediastinum/Nodes: Esophagus unremarkable. No hiatal hernia. No
thoracic adenopathy. Base of cervical region unremarkable.

Lungs/Pleura: Lungs clear. No infiltrate, pleural effusion, or
pneumothorax. No evidence of pulmonary nodule.

Upper Abdomen: Normal appearance

Musculoskeletal: Unremarkable
IMPRESSION: Normal exam.

Specifically no evidence of pulmonary nodule.

Abnormality seen on prior chest radiograph is felt to represent an
artifact.

## 2019-03-17 ENCOUNTER — Emergency Department (HOSPITAL_COMMUNITY)
Admission: EM | Admit: 2019-03-17 | Discharge: 2019-03-18 | Disposition: A | Discharge status: Home / Self Care | Payer: BLUE CROSS/BLUE SHIELD | Attending: Emergency Medicine | Admitting: Emergency Medicine

## 2019-03-17 ENCOUNTER — Encounter (HOSPITAL_COMMUNITY): Payer: Self-pay | Admitting: Emergency Medicine

## 2019-03-17 ENCOUNTER — Emergency Department (HOSPITAL_COMMUNITY): Payer: BLUE CROSS/BLUE SHIELD

## 2019-03-17 ENCOUNTER — Other Ambulatory Visit: Payer: Self-pay

## 2019-03-17 DIAGNOSIS — Z20828 Contact with and (suspected) exposure to other viral communicable diseases: Secondary | ICD-10-CM | POA: Insufficient documentation

## 2019-03-17 DIAGNOSIS — R51 Headache: Secondary | ICD-10-CM | POA: Diagnosis not present

## 2019-03-17 DIAGNOSIS — R945 Abnormal results of liver function studies: Secondary | ICD-10-CM | POA: Diagnosis not present

## 2019-03-17 DIAGNOSIS — R748 Abnormal levels of other serum enzymes: Secondary | ICD-10-CM | POA: Insufficient documentation

## 2019-03-17 DIAGNOSIS — J020 Streptococcal pharyngitis: Secondary | ICD-10-CM | POA: Diagnosis not present

## 2019-03-17 DIAGNOSIS — Z03818 Encounter for observation for suspected exposure to other biological agents ruled out: Secondary | ICD-10-CM | POA: Diagnosis not present

## 2019-03-17 DIAGNOSIS — R0602 Shortness of breath: Secondary | ICD-10-CM | POA: Diagnosis not present

## 2019-03-17 LAB — COMPREHENSIVE METABOLIC PANEL
ALT: 106 U/L — ABNORMAL HIGH (ref 0–44)
AST: 108 U/L — ABNORMAL HIGH (ref 15–41)
Albumin: 4.1 g/dL (ref 3.5–5.0)
Alkaline Phosphatase: 69 U/L (ref 38–126)
Anion gap: 11 (ref 5–15)
BUN: 5 mg/dL — ABNORMAL LOW (ref 6–20)
CO2: 21 mmol/L — ABNORMAL LOW (ref 22–32)
Calcium: 9.2 mg/dL (ref 8.9–10.3)
Chloride: 102 mmol/L (ref 98–111)
Creatinine, Ser: 0.71 mg/dL (ref 0.44–1.00)
GFR calc Af Amer: >60 mL/min (ref >60)
GFR calc non Af Amer: >60 mL/min (ref >60)
Glucose, Bld: 118 mg/dL — ABNORMAL HIGH (ref 70–99)
Potassium: 3.5 mmol/L (ref 3.5–5.1)
Sodium: 134 mmol/L — ABNORMAL LOW (ref 135–145)
Total Bilirubin: 0.7 mg/dL (ref 0.3–1.2)
Total Protein: 7.7 g/dL (ref 6.5–8.1)

## 2019-03-17 LAB — CBC
HCT: 35.2 % — ABNORMAL LOW (ref 36.0–46.0)
Hemoglobin: 12.4 g/dL (ref 12.0–15.0)
MCH: 32.6 pg (ref 26.0–34.0)
MCHC: 35.2 g/dL (ref 30.0–36.0)
MCV: 92.6 fL (ref 80.0–100.0)
Platelets: 136 10*3/uL — ABNORMAL LOW (ref 150–400)
RBC: 3.8 MIL/uL — ABNORMAL LOW (ref 3.87–5.11)
RDW: 11.4 % — ABNORMAL LOW (ref 11.5–15.5)
WBC: 11.6 10*3/uL — ABNORMAL HIGH (ref 4.0–10.5)
nRBC: 0 % (ref 0.0–0.2)

## 2019-03-17 LAB — URINALYSIS, ROUTINE W REFLEX MICROSCOPIC
Bilirubin Urine: NEGATIVE
Glucose, UA: NEGATIVE mg/dL
Hgb urine dipstick: NEGATIVE
Ketones, ur: NEGATIVE mg/dL
Leukocytes,Ua: NEGATIVE
Nitrite: NEGATIVE
Protein, ur: 30 mg/dL — AB
Specific Gravity, Urine: 1.02 (ref 1.005–1.030)
pH: 7 (ref 5.0–8.0)

## 2019-03-17 LAB — I-STAT BETA HCG BLOOD, ED (MC, WL, AP ONLY): I-stat hCG, quantitative: <5 m[IU]/mL (ref <5)

## 2019-03-17 LAB — LIPASE, BLOOD: Lipase: 37 U/L (ref 11–51)

## 2019-03-17 MED ORDER — SODIUM CHLORIDE 0.9% FLUSH: 3.0000 mL | Freq: Once | INTRAVENOUS | Status: DC

## 2019-03-17 MED ORDER — ONDANSETRON 4 MG PO TBDP
4.0000 mg | ORAL_TABLET | Freq: Once | ORAL | Status: AC | PRN
  Administered 2019-03-17: 4 mg via ORAL
  Filled 2019-03-17: qty 1

## 2019-03-17 MED ORDER — ACETAMINOPHEN 325 MG PO TABS
650.0000 mg | ORAL_TABLET | Freq: Once | ORAL | Status: AC | PRN
  Administered 2019-03-17: 650 mg via ORAL
  Filled 2019-03-17: qty 2

## 2019-03-17 MED ORDER — SODIUM CHLORIDE 0.9 % IV BOLUS
1000.0000 mL | Freq: Once | INTRAVENOUS | Status: AC
  Administered 2019-03-17: 1000 mL via INTRAVENOUS

## 2019-03-17 NOTE — ED Notes (Addendum)
Pt's husband is waiting in his car in the ED parking lot and would like updates whenever possible. Contact information is on the back of the pts "patient labels."

## 2019-03-17 NOTE — ED Triage Notes (Signed)
Pt reports a headache, fever, nausea and abdominal pain.  Pt does have a fever of 102.7.  Denies cough, sore throat but states some SOB.  No known exposure to COVID

## 2019-03-17 NOTE — ED Notes (Signed)
Pt requested a woman for her EKG due to religion.

## 2019-03-18 ENCOUNTER — Telehealth: Payer: Self-pay | Admitting: *Deleted

## 2019-03-18 LAB — DIFFERENTIAL
Abs Immature Granulocytes: 0.04 10*3/uL (ref 0.00–0.07)
Basophils Absolute: 0 10*3/uL (ref 0.0–0.1)
Basophils Relative: 0 %
Eosinophils Absolute: 0 10*3/uL (ref 0.0–0.5)
Eosinophils Relative: 0 %
Immature Granulocytes: 0 %
Lymphocytes Relative: 9 %
Lymphs Abs: 0.8 10*3/uL (ref 0.7–4.0)
Monocytes Absolute: 0.5 10*3/uL (ref 0.1–1.0)
Monocytes Relative: 5 %
Neutro Abs: 8.5 10*3/uL — ABNORMAL HIGH (ref 1.7–7.7)
Neutrophils Relative %: 86 %

## 2019-03-18 LAB — MONONUCLEOSIS SCREEN: Mono Screen: NEGATIVE

## 2019-03-18 LAB — GROUP A STREP BY PCR: Group A Strep by PCR: DETECTED — AB

## 2019-03-18 MED ORDER — AMOXICILLIN 500 MG PO CAPS
500.0000 mg | ORAL_CAPSULE | Freq: Once | ORAL | Status: AC
  Administered 2019-03-18: 500 mg via ORAL
  Filled 2019-03-18: qty 1

## 2019-03-18 MED ORDER — AMOXICILLIN 500 MG PO CAPS: 500.0000 mg | ORAL_CAPSULE | Freq: Two times a day (BID) | ORAL | 0 refills | Status: DC

## 2019-03-18 NOTE — ED Provider Notes (Signed)
[facility] EMERGENCY DEPARTMENT Provider Note   CSN: [id number] Arrival date & time: 03/17/19  2156    History   Chief Complaint Chief Complaint  Patient presents with  . Headache  . Fever  . Abdominal Pain    HPI Angel Olsen is a 48 y.o. female.     The history is provided by the patient and medical records. No language interpreter was used.  Headache  Associated symptoms: abdominal pain, fever, nausea and sore throat   Associated symptoms: no cough, no diarrhea and no vomiting   Fever  Associated symptoms: headaches, nausea and sore throat   Associated symptoms: no chest pain, no cough, no diarrhea and no vomiting   Abdominal Pain  Associated symptoms: fever, nausea, shortness of breath and sore throat   Associated symptoms: no chest pain, no constipation, no cough, no diarrhea and no vomiting    Angel Olsen is a 48 y.o. female  with PMH as listed below who presents to the Emergency Department complaining of fever for the last 3 days.  Associated symptoms include chills, shortness of breath, headache, sore throat and nausea.  She denies neck pain, chest pain, abdominal pain, vomiting, diarrhea, cough or urinary symptoms.  She has been taking Tylenol as needed for fever.  Last dose was about noon today.  She denies any known sick contacts.  She has stayed at home, has not gone anywhere since the end of March.  She does live with her husband who has been going to work at [employer] daily, so he has had contact with several people.  He has exhibited no symptoms.  She denies any history of similar illness.  She feels as if her symptoms are getting worse.  She got winded with very little exertion.   Past Medical History:  Diagnosis Date  . Medical history non-contributory     Patient Active Problem List   Diagnosis Date Noted  . VBAC, delivered, current hospitalization 07/21/2014  . Second-degree perineal laceration, with delivery 07/21/2014  . PROM  (premature rupture of membranes) 07/20/2014  . Multigravida of advanced maternal age 33/10/2014  . History of cesarean delivery 07/20/2014  . History of successful vaginal birth after cesarean, currently pregnant 07/20/2014    Past Surgical History:  Procedure Laterality Date  . CESAREAN SECTION    . HEMORROIDECTOMY       OB History    Gravida  3   Para  3   Term  3   Preterm      AB      Living  3     SAB      TAB      Ectopic      Multiple      Live Births  1            Home Medications    Prior to Admission medications   Medication Sig Start Date End Date Taking? Authorizing Provider  ibuprofen (ADVIL,MOTRIN) 600 MG tablet Take 1 tablet (600 mg total) by mouth every 6 (six) hours as needed. Patient not taking: Reported on 03/17/2019 07/23/14   Gerald Leitz, MD    Family History No family history on file.  Social History Social History   Tobacco Use  . Smoking status: Never Smoker  Substance Use Topics  . Alcohol use: No  . Drug use: No     Allergies   Patient has no known allergies.   Review of Systems Review of Systems  Constitutional: Positive  for fever.  HENT: Positive for sore throat.   Respiratory: Positive for shortness of breath. Negative for cough and wheezing.   Cardiovascular: Negative for chest pain.  Gastrointestinal: Positive for abdominal pain and nausea. Negative for constipation, diarrhea and vomiting.  Neurological: Positive for headaches.  All other systems reviewed and are negative.    Physical Exam Updated Vital Signs BP 113/72   Pulse 99   Temp (!) 102.7 F (39.3 C) (Oral)   Resp 17   SpO2 98%   Physical Exam Vitals signs and nursing note reviewed.  Constitutional:      General: She is not in acute distress.    Appearance: She is well-developed.  HENT:     Head: Normocephalic and atraumatic.     Mouth/Throat:     Comments: OP with erythema, but no tonsillar hypertrophy. Neck:     Musculoskeletal:  Neck supple.     Comments: No tenderness. Full ROM without difficulty. No meningeal signs. Cardiovascular:     Heart sounds: Normal heart sounds. No murmur.     Comments: Mild tachycardia, but regular rhythm.  Pulmonary:     Effort: Pulmonary effort is normal. No respiratory distress.     Breath sounds: Normal breath sounds.     Comments: Lungs clear to auscultation bilaterally. Abdominal:     General: There is no distension.     Palpations: Abdomen is soft.     Comments: No abdominal tenderness.  Lymphadenopathy:     Cervical: Cervical adenopathy present.  Skin:    General: Skin is warm and dry.  Neurological:     Mental Status: She is alert and oriented to person, place, and time.     Comments: Speech clear and goal oriented. CN 2-12 grossly intact. Strength and sensation intact.      ED Treatments / Results  Labs (all labs ordered are listed, but only abnormal results are displayed) Labs Reviewed  COMPREHENSIVE METABOLIC PANEL - Abnormal; Notable for the following components:      Result Value   Sodium 134 (*)    CO2 21 (*)    Glucose, Bld 118 (*)    BUN 5 (*)    AST 108 (*)    ALT 106 (*)    All other components within normal limits  CBC - Abnormal; Notable for the following components:   WBC 11.6 (*)    RBC 3.80 (*)    HCT 35.2 (*)    RDW 11.4 (*)    Platelets 136 (*)    All other components within normal limits  URINALYSIS, ROUTINE W REFLEX MICROSCOPIC - Abnormal; Notable for the following components:   APPearance HAZY (*)    Protein, ur 30 (*)    Bacteria, UA RARE (*)    All other components within normal limits  GROUP A STREP BY PCR  NOVEL CORONAVIRUS, NAA (HOSPITAL ORDER, SEND-OUT TO REF LAB)  LIPASE, BLOOD  MONONUCLEOSIS SCREEN  DIFFERENTIAL  I-STAT BETA HCG BLOOD, ED ([facility], [facility], [facility] ONLY)    EKG None  Radiology Dg Chest Portable 1 View  Result Date: 03/18/2019 CLINICAL DATA:  Shortness of Breath EXAM: PORTABLE CHEST 1 VIEW COMPARISON:  None.  FINDINGS: The heart size and mediastinal contours are within normal limits. Both lungs are clear. The visualized skeletal structures are unremarkable. IMPRESSION: No active disease. Electronically Signed   By: Alcide CleverMark  Lukens M.D.   On: 03/18/2019 00:00    Procedures Procedures (including critical care time)  Medications Ordered in ED Medications  sodium chloride flush (NS) 0.9 % injection 3 mL (has no administration in time range)  ondansetron (ZOFRAN-ODT) disintegrating tablet 4 mg (4 mg Oral Given 03/17/19 2240)  acetaminophen (TYLENOL) tablet 650 mg (650 mg Oral Given 03/17/19 2311)  sodium chloride 0.9 % bolus 1,000 mL (1,000 mLs Intravenous New Bag/Given 03/17/19 2357)     Initial Impression / Assessment and Plan / ED Course  I have reviewed the triage vital signs and the nursing notes.  Pertinent labs & imaging results that were available during my care of the patient were reviewed by me and considered in my medical decision making (see chart for details).       Angel Olsen is a 48 y.o. female who presents to ED for fever, headache, shortness of breath, nausea and sore throat which began 3 days ago and has been progressively worsening.  Temperature of 102.7 upon arrival with mild tachycardia.  Given Tylenol.  Fluid bolus and Zofran provided as well.  She has no abdominal tenderness.  No urinary symptoms and UA without signs of infection.  Lungs are clear to auscultation and chest x-ray is negative.  Dependently reviewed by me without signs of pneumonia.  Labs reviewed and notable for leukocytosis of 11 and elevated AST/ALT at 108 and 106 respectively. Given she has no abdominal tenderness, do not feel further imaging of the abdomen is warranted at this time. Covid in differential. Send out covid test ordered. Given sore throat, strep and mono ordered as well.  Strep, mono and differential for CBC pending at shift change. Care assumed on oncoming provider PA Geiple. Case discussed. Plan agreed  upon. Will follow up on pending labs and dispo appropriately.   Patient seen by and discussed with Dr. Eudelia Bunch who agrees with plan of care.    Final Clinical Impressions(s) / ED Diagnoses   Final diagnoses:  None    ED Discharge Orders    None       Avia Merkley, Chase Picket, PA-C 03/18/19 0020    Nira Conn, MD 03/18/19 [phone]

## 2019-03-18 NOTE — ED Provider Notes (Signed)
1:50 AM Handoff from Ward PA-C at shift change.   Patient with flulike symptoms including sore throat, abdominal pain, fever, nausea, myalgias.  Patient with work-up here which shows streptococcal pharyngitis.  She has mild transaminitis likely related to infection.  Monospot negative.  Coronavirus test is pending.  Chest x-ray without signs of pneumonia.  UA without signs of infection.  Patient will be started on amoxicillin.  She is in no distress on reexam.  She is informed of results.  Encouraged to follow-up with her doctor next week for recheck of her liver enzymes.   Encouraged to self-isolate until her symptoms improve for 72 hrs and her COVID test has returned.   BP 119/69   Pulse 83   Temp 99.6 F (37.6 C) (Oral)   Resp 17   SpO2 100%   Patient urged to return with worsening symptoms or other concerns. Patient verbalized understanding and agrees with plan.       Renne Crigler, PA-C 03/18/19 0154    Nira Conn, MD 03/18/19 [phone]

## 2019-03-18 NOTE — Telephone Encounter (Signed)
9:15 am Received call from pt's husband requesting RX be called to [facility] on [address], her pharmacy does not open until 10 am. Isidoro Donning RN CCM Case Mgmt phone [phone]

## 2019-03-18 NOTE — Discharge Instructions (Signed)
Please read and follow all provided instructions.  Your diagnoses today include:  1. Strep throat   2. Elevated liver enzymes     Tests performed today include:  Strep test: was POSITIVE for strep throat  Coronavirus test -is pending, results will take 24 to 48 hours  Blood counts and electrolytes  Liver function test -is high, likely related to your infection.  You will have to have this rechecked by your doctor.  Urine test -no sign of infection  Chest x-ray-no sign of pneumonia  Vital signs. See below for your results today.   Medications prescribed:   Amoxicillin - antibiotic  You have been prescribed an antibiotic medicine: take the entire course of medicine even if you are feeling better. Stopping early can cause the antibiotic not to work.  Take any medications prescribed only as directed.   Home care instructions:  Please read the educational materials provided and follow any instructions contained in this packet.  Follow-up instructions: Please follow-up with your primary care provider as needed for further evaluation of your symptoms.  Return instructions:   Please return to the Emergency Department if you experience worsening symptoms.   Return if you have worsening problems swallowing, your neck becomes swollen, you cannot swallow your saliva or your voice becomes muffled.   Return with high persistent fever, persistent vomiting, or if you have trouble breathing.   Please return if you have any other emergent concerns.  Additional Information:  Your vital signs today were: BP 119/69    Pulse 83    Temp 99.6 F (37.6 C) (Oral)    Resp 17    SpO2 100%  If your blood pressure (BP) was elevated above 135/85 this visit, please have this repeated by your doctor within one month. --------------

## 2019-03-18 NOTE — ED Notes (Signed)
PT states understanding of care given, follow up care, and medication prescribed. PT ambulated from ED to car with a steady gait. 

## 2019-03-19 LAB — NOVEL CORONAVIRUS, NAA (HOSP ORDER, SEND-OUT TO REF LAB; TAT 18-24 HRS): SARS-CoV-2, NAA: NOT DETECTED

## 2019-06-25 DIAGNOSIS — M545 Low back pain: Secondary | ICD-10-CM | POA: Diagnosis not present

## 2019-11-12 DIAGNOSIS — I83812 Varicose veins of left lower extremities with pain: Secondary | ICD-10-CM | POA: Diagnosis not present

## 2019-11-26 DIAGNOSIS — I83812 Varicose veins of left lower extremities with pain: Secondary | ICD-10-CM | POA: Diagnosis not present

## 2020-01-04 DIAGNOSIS — H01004 Unspecified blepharitis left upper eyelid: Secondary | ICD-10-CM | POA: Diagnosis not present

## 2020-01-07 DIAGNOSIS — H00014 Hordeolum externum left upper eyelid: Secondary | ICD-10-CM | POA: Diagnosis not present

## 2020-02-04 DIAGNOSIS — H00014 Hordeolum externum left upper eyelid: Secondary | ICD-10-CM | POA: Diagnosis not present

## 2020-03-13 ENCOUNTER — Ambulatory Visit: Payer: Self-pay | Attending: Internal Medicine

## 2020-03-13 DIAGNOSIS — Z23 Encounter for immunization: Secondary | ICD-10-CM

## 2020-03-13 NOTE — Progress Notes (Signed)
   Covid-19 Vaccination Clinic  Name:  Canaan Prue    MRN: [id number] DOB: [birth year]  03/13/2020  Ms. Delorey was observed post Covid-19 immunization for 15 minutes without incident. She was provided with Vaccine Information Sheet and instruction to access the V-Safe system.   Ms. Giannone was instructed to call 911 with any severe reactions post vaccine: Marland Kitchen Difficulty breathing  . Swelling of face and throat  . A fast heartbeat  . A bad rash all over body  . Dizziness and weakness   Immunizations Administered    Name Date Dose VIS Date Route   Pfizer COVID-19 Vaccine 03/13/2020  9:41 AM 0.3 mL 01/02/2019 Intramuscular   Manufacturer: [employer], [employer]   Lot: [id number]   NDC: [address]-9

## 2020-04-08 ENCOUNTER — Ambulatory Visit: Payer: Self-pay | Attending: Internal Medicine

## 2020-04-08 DIAGNOSIS — Z23 Encounter for immunization: Secondary | ICD-10-CM

## 2020-04-08 NOTE — Progress Notes (Signed)
   Covid-19 Vaccination Clinic  Name:  [name]    MRN: [id number] DOB: 1971/09/17  04/08/2020  [name] was observed post Covid-19 immunization for 15 minutes without incident. She was provided with Vaccine Information Sheet and instruction to access the V-Safe system.   [name] was instructed to call 911 with any severe reactions post vaccine: Marland Kitchen Difficulty breathing  . Swelling of face and throat  . A fast heartbeat  . A bad rash all over body  . Dizziness and weakness   Immunizations Administered    Name Date Dose VIS Date Route   Pfizer COVID-19 Vaccine 04/08/2020  9:39 AM 0.3 mL 01/02/2019 Intramuscular   Manufacturer: [employer], [employer]   Lot: [id number]   NDC: [address]-8

## 2020-05-08 DIAGNOSIS — H02881 Meibomian gland dysfunction right upper eyelid: Secondary | ICD-10-CM | POA: Diagnosis not present

## 2020-05-08 DIAGNOSIS — H5712 Ocular pain, left eye: Secondary | ICD-10-CM | POA: Diagnosis not present

## 2020-05-08 DIAGNOSIS — H02884 Meibomian gland dysfunction left upper eyelid: Secondary | ICD-10-CM | POA: Diagnosis not present

## 2020-05-08 DIAGNOSIS — H02882 Meibomian gland dysfunction right lower eyelid: Secondary | ICD-10-CM | POA: Diagnosis not present

## 2020-06-18 DIAGNOSIS — H0288B Meibomian gland dysfunction left eye, upper and lower eyelids: Secondary | ICD-10-CM | POA: Diagnosis not present

## 2020-06-18 DIAGNOSIS — H0288A Meibomian gland dysfunction right eye, upper and lower eyelids: Secondary | ICD-10-CM | POA: Diagnosis not present

## 2020-06-18 DIAGNOSIS — H5712 Ocular pain, left eye: Secondary | ICD-10-CM | POA: Diagnosis not present

## 2020-06-20 IMAGING — DX PORTABLE CHEST - 1 VIEW
1 series · 1 of 1 positions shown · non-contrast
Comparison: None.

CLINICAL DATA: Shortness of Breath

EXAM:
PORTABLE CHEST 1 VIEW

[chest ap]
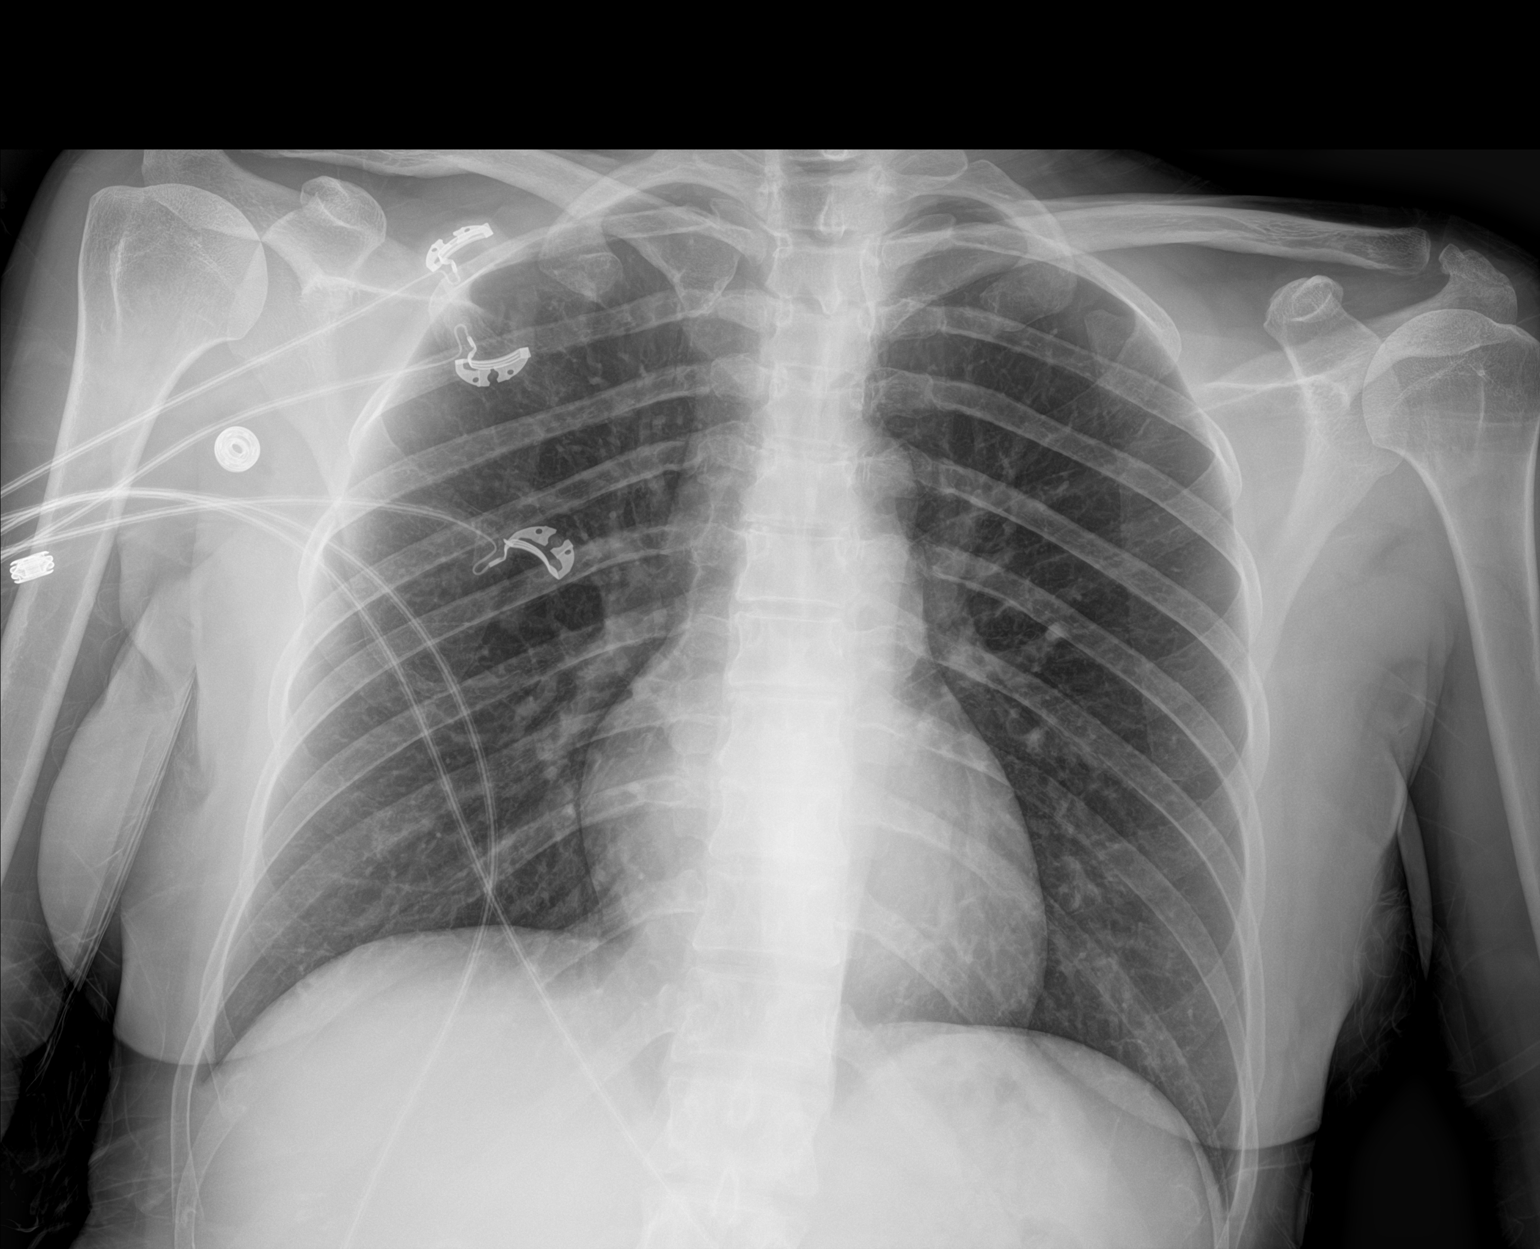

[1 of 1 positions shown; findings below may reference images not displayed]

FINDINGS: The heart size and mediastinal contours are within normal limits.
Both lungs are clear. The visualized skeletal structures are
unremarkable.
IMPRESSION: No active disease.

## 2020-11-10 ENCOUNTER — Ambulatory Visit: Payer: Self-pay | Attending: Internal Medicine

## 2020-11-10 DIAGNOSIS — Z23 Encounter for immunization: Secondary | ICD-10-CM

## 2020-11-10 NOTE — Progress Notes (Signed)
   Covid-19 Vaccination Clinic  Name:  Angel Olsen    MRN: [id number] DOB: May 24, 1971  11/10/2020  [name] was observed post Covid-19 immunization for 15 minutes without incident. She was provided with Vaccine Information Sheet and instruction to access the V-Safe system.   [name] was instructed to call 911 with any severe reactions post vaccine: Marland Kitchen Difficulty breathing  . Swelling of face and throat  . A fast heartbeat  . A bad rash all over body  . Dizziness and weakness   Immunizations Administered    Name Date Dose VIS Date Route   Pfizer COVID-19 Vaccine 11/10/2020  1:57 PM 0.3 mL 08/27/2020 Intramuscular   Manufacturer: [employer], [employer]   Lot: [id number]   NDC: [address]-8

## 2020-11-12 ENCOUNTER — Other Ambulatory Visit: Payer: Self-pay | Admitting: Obstetrics and Gynecology

## 2020-11-12 DIAGNOSIS — N841 Polyp of cervix uteri: Secondary | ICD-10-CM | POA: Diagnosis not present

## 2020-11-12 DIAGNOSIS — N939 Abnormal uterine and vaginal bleeding, unspecified: Secondary | ICD-10-CM | POA: Diagnosis not present

## 2020-11-13 DIAGNOSIS — N939 Abnormal uterine and vaginal bleeding, unspecified: Secondary | ICD-10-CM | POA: Diagnosis not present

## 2020-11-19 DIAGNOSIS — H0288A Meibomian gland dysfunction right eye, upper and lower eyelids: Secondary | ICD-10-CM | POA: Diagnosis not present

## 2020-11-19 DIAGNOSIS — H16143 Punctate keratitis, bilateral: Secondary | ICD-10-CM | POA: Diagnosis not present

## 2020-11-19 DIAGNOSIS — H353121 Nonexudative age-related macular degeneration, left eye, early dry stage: Secondary | ICD-10-CM | POA: Diagnosis not present

## 2020-11-19 DIAGNOSIS — H02535 Eyelid retraction left lower eyelid: Secondary | ICD-10-CM | POA: Diagnosis not present

## 2020-11-24 ENCOUNTER — Encounter (INDEPENDENT_AMBULATORY_CARE_PROVIDER_SITE_OTHER): Payer: Self-pay | Admitting: Ophthalmology

## 2020-11-27 DIAGNOSIS — N898 Other specified noninflammatory disorders of vagina: Secondary | ICD-10-CM | POA: Diagnosis not present

## 2020-11-27 DIAGNOSIS — D649 Anemia, unspecified: Secondary | ICD-10-CM | POA: Diagnosis not present

## 2020-11-27 DIAGNOSIS — N393 Stress incontinence (female) (male): Secondary | ICD-10-CM | POA: Diagnosis not present

## 2020-11-27 DIAGNOSIS — N939 Abnormal uterine and vaginal bleeding, unspecified: Secondary | ICD-10-CM | POA: Diagnosis not present

## 2020-12-08 ENCOUNTER — Encounter (INDEPENDENT_AMBULATORY_CARE_PROVIDER_SITE_OTHER): Payer: Self-pay | Admitting: Ophthalmology

## 2020-12-22 ENCOUNTER — Other Ambulatory Visit: Payer: Self-pay

## 2020-12-22 ENCOUNTER — Encounter (INDEPENDENT_AMBULATORY_CARE_PROVIDER_SITE_OTHER): Payer: BC Managed Care – PPO | Admitting: Ophthalmology

## 2020-12-22 DIAGNOSIS — H353122 Nonexudative age-related macular degeneration, left eye, intermediate dry stage: Secondary | ICD-10-CM

## 2020-12-22 DIAGNOSIS — H2513 Age-related nuclear cataract, bilateral: Secondary | ICD-10-CM

## 2020-12-22 DIAGNOSIS — H43813 Vitreous degeneration, bilateral: Secondary | ICD-10-CM

## 2021-03-04 ENCOUNTER — Ambulatory Visit: Payer: BC Managed Care – PPO | Admitting: Family Medicine

## 2021-03-04 ENCOUNTER — Encounter: Payer: Self-pay | Admitting: Family Medicine

## 2021-03-04 ENCOUNTER — Other Ambulatory Visit: Payer: Self-pay

## 2021-03-04 VITALS — BP 126/80 | HR 62 | Temp 98.3°F | Resp 20 | Ht 64.5 in | Wt 131.4 lb

## 2021-03-04 DIAGNOSIS — N939 Abnormal uterine and vaginal bleeding, unspecified: Secondary | ICD-10-CM | POA: Insufficient documentation

## 2021-03-04 DIAGNOSIS — L84 Corns and callosities: Secondary | ICD-10-CM

## 2021-03-04 DIAGNOSIS — Z1211 Encounter for screening for malignant neoplasm of colon: Secondary | ICD-10-CM | POA: Diagnosis not present

## 2021-03-04 DIAGNOSIS — R29898 Other symptoms and signs involving the musculoskeletal system: Secondary | ICD-10-CM

## 2021-03-04 DIAGNOSIS — N393 Stress incontinence (female) (male): Secondary | ICD-10-CM | POA: Insufficient documentation

## 2021-03-04 DIAGNOSIS — Z1231 Encounter for screening mammogram for malignant neoplasm of breast: Secondary | ICD-10-CM | POA: Diagnosis not present

## 2021-03-04 LAB — CBC WITH DIFFERENTIAL/PLATELET
Basophils Absolute: 0 10*3/uL (ref 0.0–0.1)
Basophils Relative: 0.3 % (ref 0.0–3.0)
Eosinophils Absolute: 0 10*3/uL (ref 0.0–0.7)
Eosinophils Relative: 0.6 % (ref 0.0–5.0)
HCT: 37.9 % (ref 36.0–46.0)
Hemoglobin: 13 g/dL (ref 12.0–15.0)
Lymphocytes Relative: 43.5 % (ref 12.0–46.0)
Lymphs Abs: 2 10*3/uL (ref 0.7–4.0)
MCHC: 34.3 g/dL (ref 30.0–36.0)
MCV: 94.4 fl (ref 78.0–100.0)
Monocytes Absolute: 0.3 10*3/uL (ref 0.1–1.0)
Monocytes Relative: 6.6 % (ref 3.0–12.0)
Neutro Abs: 2.3 10*3/uL (ref 1.4–7.7)
Neutrophils Relative %: 49 % (ref 43.0–77.0)
Platelets: 154 10*3/uL (ref 150.0–400.0)
RBC: 4.01 Mil/uL (ref 3.87–5.11)
RDW: 12.6 % (ref 11.5–15.5)
WBC: 4.6 10*3/uL (ref 4.0–10.5)

## 2021-03-04 LAB — BASIC METABOLIC PANEL
BUN: 10 mg/dL (ref 6–23)
CO2: 22 mEq/L (ref 19–32)
Calcium: 9.5 mg/dL (ref 8.4–10.5)
Chloride: 102 mEq/L (ref 96–112)
Creatinine, Ser: 0.65 mg/dL (ref 0.40–1.20)
GFR: 104.54 mL/min (ref >60.00)
Glucose, Bld: 86 mg/dL (ref 70–99)
Potassium: 4 mEq/L (ref 3.5–5.1)
Sodium: 135 mEq/L (ref 135–145)

## 2021-03-04 LAB — HEPATIC FUNCTION PANEL
ALT: 10 U/L (ref 0–35)
AST: 15 U/L (ref 0–37)
Albumin: 4.7 g/dL (ref 3.5–5.2)
Alkaline Phosphatase: 42 U/L (ref 39–117)
Bilirubin, Direct: 0.1 mg/dL (ref 0.0–0.3)
Total Bilirubin: 0.4 mg/dL (ref 0.2–1.2)
Total Protein: 7.9 g/dL (ref 6.0–8.3)

## 2021-03-04 LAB — TSH: TSH: 2.33 u[IU]/mL (ref 0.35–4.50)

## 2021-03-04 LAB — VITAMIN D 25 HYDROXY (VIT D DEFICIENCY, FRACTURES): VITD: 30.8 ng/mL (ref 30.00–100.00)

## 2021-03-04 LAB — B12 AND FOLATE PANEL
Folate: 11.9 ng/mL (ref >5.9)
Vitamin B-12: >1506 pg/mL — ABNORMAL HIGH (ref 211–911)

## 2021-03-04 NOTE — Patient Instructions (Signed)
We'll decide the next steps based on your lab results We'll notify you of your lab results and make any changes if needed We'll call you with your GI referral for the colonoscopy consultation They should call you to schedule your mammogram You have a corn on your toe.  Get an OTC corn pad to help with this Call with any questions or concerns Welcome!  We're glad to have you!

## 2021-03-04 NOTE — Progress Notes (Signed)
Subjective:    Patient ID: Angel Olsen, female    DOB: [birth year], 50 y.o.   MRN: [id number]  HPI New to establish.  Previously went to [facility].  GYN- Gerald Leitz  Health Maintenance- UTD on pap (11/12/20).  Due for mammogram.  Due for colonoscopy.  L sided heaviness- pt reports L sided heaviness from head to feet.  No sxs on R side.  Sxs are constant.  sxs started ~2-3 yrs ago.  sxs are unchanged during that time period- not improving or worsening.  Sxs are less noticeable when up and moving around.  More noticeable at rest.  No known trigger- no illness, injury, or inciting event.  Denies SOB.  Pt told GYN that she had nausea and dizziness and she started iron.  No relief w/ taking iron.  No change w/ eating.  Pt is R hand dominant.  L 4th toe pain- pt has been applying OTC athlete's foot w/o relief.  + bump present between toes   Review of Systems For ROS see HPI   This visit occurred during the SARS-CoV-2 public health emergency.  Safety protocols were in place, including screening questions prior to the visit, additional usage of staff PPE, and extensive cleaning of exam room while observing appropriate contact time as indicated for disinfecting solutions.       Objective:   Physical Exam Vitals reviewed.  Constitutional:      General: She is not in acute distress.    Appearance: Normal appearance. She is not ill-appearing.  HENT:     Head: Normocephalic and atraumatic.  Eyes:     Extraocular Movements: Extraocular movements intact.     Conjunctiva/sclera: Conjunctivae normal.     Pupils: Pupils are equal, round, and reactive to light.  Cardiovascular:     Rate and Rhythm: Normal rate and regular rhythm.     Pulses: Normal pulses.     Heart sounds: Normal heart sounds. No murmur heard.   Pulmonary:     Effort: Pulmonary effort is normal. No respiratory distress.     Breath sounds: Normal breath sounds. No wheezing or rhonchi.  Abdominal:     General: Abdomen is  flat. There is no distension.     Palpations: Abdomen is soft.     Tenderness: There is no abdominal tenderness. There is no guarding or rebound.  Musculoskeletal:     Cervical back: Normal range of motion and neck supple.     Right lower leg: No edema.     Left lower leg: No edema.  Lymphadenopathy:     Cervical: No cervical adenopathy.  Skin:    General: Skin is warm and dry.     Comments: Corn on L 4th toe  Neurological:     General: No focal deficit present.     Mental Status: She is alert and oriented to person, place, and time.     Cranial Nerves: No cranial nerve deficit.     Sensory: Sensory deficit (subjective 'heaviness' of L side) present.     Motor: No weakness.     Coordination: Coordination normal.     Gait: Gait normal.  Psychiatric:        Behavior: Behavior normal.        Thought Content: Thought content normal.     Comments: Mildly anxious           Assessment & Plan:  Sensation of heaviness- new.  L side only.  sxs started 2-3 yrs ago and have  been consistent since onset.  Sxs are less noticeable with activity but do not resolve.  Pt has not noticed a particular trigger and nothing improves sxs.  Will check labs to r/o metabolic cause but discussed that a neuro referral is the next step.  Pt and husband would like to hold on referral until lab results are available.  Toe Corn- new.  Reviewed dx and supportive care with pt and husband.  They will start w/ OTC corn pads and if no improvement will refer to podiatry.  Health Maintenance- referral placed for mammo and colonoscopy

## 2021-03-05 ENCOUNTER — Other Ambulatory Visit: Payer: Self-pay

## 2021-03-05 DIAGNOSIS — R29898 Other symptoms and signs involving the musculoskeletal system: Secondary | ICD-10-CM

## 2021-03-06 LAB — ANA: Anti Nuclear Antibody (ANA): NEGATIVE

## 2021-03-06 LAB — IRON,TIBC AND FERRITIN PANEL
%SAT: 17 % (calc) (ref 16–45)
Ferritin: 20 ng/mL (ref 16–232)
Iron: 56 ug/dL (ref 40–190)
TIBC: 330 mcg/dL (calc) (ref 250–450)

## 2021-03-17 ENCOUNTER — Encounter: Payer: Self-pay | Admitting: Family Medicine

## 2021-04-27 DIAGNOSIS — H0288A Meibomian gland dysfunction right eye, upper and lower eyelids: Secondary | ICD-10-CM | POA: Diagnosis not present

## 2021-04-27 DIAGNOSIS — H5712 Ocular pain, left eye: Secondary | ICD-10-CM | POA: Diagnosis not present

## 2021-04-27 DIAGNOSIS — H16143 Punctate keratitis, bilateral: Secondary | ICD-10-CM | POA: Diagnosis not present

## 2021-04-27 DIAGNOSIS — H353120 Nonexudative age-related macular degeneration, left eye, stage unspecified: Secondary | ICD-10-CM | POA: Diagnosis not present

## 2021-07-06 ENCOUNTER — Encounter: Payer: Self-pay | Admitting: Diagnostic Neuroimaging

## 2021-07-06 ENCOUNTER — Ambulatory Visit: Payer: BC Managed Care – PPO | Admitting: Diagnostic Neuroimaging

## 2022-02-05 DIAGNOSIS — H5712 Ocular pain, left eye: Secondary | ICD-10-CM | POA: Diagnosis not present

## 2022-02-05 DIAGNOSIS — H0288A Meibomian gland dysfunction right eye, upper and lower eyelids: Secondary | ICD-10-CM | POA: Diagnosis not present

## 2022-02-05 DIAGNOSIS — H353121 Nonexudative age-related macular degeneration, left eye, early dry stage: Secondary | ICD-10-CM | POA: Diagnosis not present

## 2022-02-05 DIAGNOSIS — H16143 Punctate keratitis, bilateral: Secondary | ICD-10-CM | POA: Diagnosis not present

## 2022-06-08 DIAGNOSIS — H16143 Punctate keratitis, bilateral: Secondary | ICD-10-CM | POA: Diagnosis not present

## 2022-06-08 DIAGNOSIS — H0288A Meibomian gland dysfunction right eye, upper and lower eyelids: Secondary | ICD-10-CM | POA: Diagnosis not present

## 2022-06-08 DIAGNOSIS — H5712 Ocular pain, left eye: Secondary | ICD-10-CM | POA: Diagnosis not present

## 2022-06-08 DIAGNOSIS — H0288B Meibomian gland dysfunction left eye, upper and lower eyelids: Secondary | ICD-10-CM | POA: Diagnosis not present

## 2022-07-27 ENCOUNTER — Encounter: Payer: Self-pay | Admitting: Family Medicine

## 2022-07-27 ENCOUNTER — Ambulatory Visit (INDEPENDENT_AMBULATORY_CARE_PROVIDER_SITE_OTHER): Payer: BC Managed Care – PPO | Admitting: Family Medicine

## 2022-07-27 VITALS — BP 100/68 | HR 65 | Temp 99.9°F | Resp 16 | Ht 64.5 in | Wt 138.1 lb

## 2022-07-27 DIAGNOSIS — M79662 Pain in left lower leg: Secondary | ICD-10-CM

## 2022-07-27 DIAGNOSIS — Z23 Encounter for immunization: Secondary | ICD-10-CM | POA: Diagnosis not present

## 2022-07-27 DIAGNOSIS — M792 Neuralgia and neuritis, unspecified: Secondary | ICD-10-CM

## 2022-07-27 MED ORDER — PREGABALIN 75 MG PO CAPS: 75.0000 mg | ORAL_CAPSULE | Freq: Two times a day (BID) | ORAL | 1 refills | Status: DC

## 2022-07-27 NOTE — Patient Instructions (Signed)
Follow up as needed or as scheduled START the Lyrica (Pregabalin) twice daily to help w/ the scalp pain If this improves your pain, we can cancel the neurology appt If the pain continues, please schedule w/ neurology when they call I would go back to the Vein Specialist to check on the L leg pain Call with any questions or concerns Hang in there!!!

## 2022-07-27 NOTE — Progress Notes (Signed)
   Subjective:    Patient ID: Angel Olsen, female    DOB: 1971/05/16, 51 y.o.   MRN: [id number]  HPI Scalp pain- sxs started ~1 yr ago.  They will come and go.  Initially pain was located in small area on L side but this has spread across the top of her head.  Head is TTP and she has a difficult time with her hair due to pain.  States pain is not sharp but 'something is not right'.  Pain is worse when fatigued or when hair is wet.  Some improvement w/ ibuprofen.  No particular triggers or time of day.  Pt reports some 'foggy vision' recently but isn't sure if it's related.  Pain was initially only 1 time/week but she is now having pain daily.  Decreased knee flexion- pt reports L leg heaviness and at times inability to bend knee due to discomfort.  Denies redness or swelling.  Has hx of varicose veins s/p needle treatment.   Review of Systems For ROS see HPI     Objective:   Physical Exam Vitals reviewed.  Constitutional:      General: She is not in acute distress.    Appearance: She is well-developed. She is not ill-appearing.  HENT:     Head: Normocephalic and atraumatic. No abrasion, contusion or laceration. Hair is normal.      Comments: TTP over L and R scalp in almost band like pattern Musculoskeletal:        General: Tenderness (TTP over L posterolateral calf w/o palpable cord, edema, erythema, or deformity) present. No swelling.  Skin:    General: Skin is warm and dry.     Findings: No bruising, ecchymosis, erythema, rash or wound.  Neurological:     Mental Status: She is alert and oriented to person, place, and time.     Cranial Nerves: No cranial nerve deficit or facial asymmetry.  Psychiatric:        Mood and Affect: Mood normal.        Behavior: Behavior normal.           Assessment & Plan:  Neuralgia- new.  Sxs started for pt ~1 yr ago and were initially intermittent.  Since then have increased in frequency from once weekly to daily.  No obvious abnormality of  scalp to explain pain.  Sxs sound neuropathic.  Will start Lyrica to see if pain improves and will refer to Neuro for complete evaluation.  Pt expressed understanding and is in agreement w/ plan.   L calf pain- new to provider.  Pt reports this feels similar to when she was treated for varicose veins.  No visible varicosities but explained that these can be deeper.  No evidence of DVT or Baker's cyst.  Pt was instructed to return to Vascular doctor for evaluation and tx.  Pt expressed understanding and is in agreement w/ plan.

## 2022-08-02 DIAGNOSIS — H0288B Meibomian gland dysfunction left eye, upper and lower eyelids: Secondary | ICD-10-CM | POA: Diagnosis not present

## 2022-08-02 DIAGNOSIS — H0288A Meibomian gland dysfunction right eye, upper and lower eyelids: Secondary | ICD-10-CM | POA: Diagnosis not present

## 2022-08-02 DIAGNOSIS — H5712 Ocular pain, left eye: Secondary | ICD-10-CM | POA: Diagnosis not present

## 2022-08-02 DIAGNOSIS — H16143 Punctate keratitis, bilateral: Secondary | ICD-10-CM | POA: Diagnosis not present

## 2022-08-06 ENCOUNTER — Telehealth: Payer: Self-pay | Admitting: Family Medicine

## 2022-08-06 MED ORDER — PREGABALIN 150 MG PO CAPS: 150.0000 mg | ORAL_CAPSULE | Freq: Two times a day (BID) | ORAL | 3 refills | Status: AC

## 2022-08-06 NOTE — Telephone Encounter (Signed)
Encourage patient to contact the pharmacy for refills or they can request refills through [facility]  (Please schedule appointment if patient has not been seen in over a year)    WHAT PHARMACY WOULD THEY LIKE THIS SENT TO:  [place], [place]: Pregablin 75 mg   NOTES/COMMENTS FROM PATIENT: Pt husband called stating that Dr.Tabori increased his wife medication to twice a day and its working great for the pt. Pt needs a early refill due to the increase on medication.      [place] office please notify patient: It takes 48-72 hours to process rx refill requests Ask patient to call pharmacy to ensure rx is ready before heading there.

## 2022-08-06 NOTE — Telephone Encounter (Signed)
Spoke w/ pt husband and advised the Rx has been sent to the pharmacy

## 2022-08-06 NOTE — Addendum Note (Signed)
Addended by: Midge Minium on: 08/06/2022 03:17 PM   Modules accepted: Orders

## 2022-08-06 NOTE — Telephone Encounter (Signed)
New prescription sent for the higher dose of Lyrica.  She only needs 1 pill twice daily since we increased the dose.

## 2022-09-01 NOTE — Progress Notes (Unsigned)
Referring:  Midge Minium, MD [address] [place],  [place] [address]  PCP: Midge Minium, MD  Neurology was asked to evaluate Angel Olsen, a 51 year old female for a chief complaint of headaches.  Our recommendations of care will be communicated by shared medical record.    CC:  headaches  History provided from self, husband  HPI:  Medical co-morbidities: none  The patient presents for evaluation of headaches which began one year ago. Headaches are described as tenderness in her left temple, occiput, and vertex. Headaches are associated with nausea and phonophobia. They have been worsening over time and are now constant. She also reports mild blurred vision in her left eye, but does have a history of dry eyes and is not sure if this is related.  She also endorses neck pain on the left side that radiates down her left arm. Her left arm also feels weaker than her right. She also feels her left leg is weaker.  Her PCP started her on Lyrica in September. She uptitrated the dose to 150 mg BID, which did help reduce the pain but did not resolve it. She stopped the medication as she did not feel it was effective enough. Denies side effects. She is not currently taking anything for her headaches.  Headache History: Onset: 1 year ago Triggers: loud noises Aura: blurry vision Location: left occiput, vertex Quality/Description: tenderness Associated Symptoms:  Photophobia: no  Phonophobia: yes  Nausea: yes Worse with activity?: yes Duration of headaches: constant  Headache days per month: 30 Headache free days per month: 0  Current Treatment: Abortive none  Preventative none  Prior Therapies                                 Lyrica 150 mg BID - lack of efficacy   LABS: CBC    Component Value Date/Time   WBC 4.6 03/04/2021 1044   RBC 4.01 03/04/2021 1044   HGB 13.0 03/04/2021 1044   HCT 37.9 03/04/2021 1044   PLT 154.0 03/04/2021 1044   MCV 94.4  03/04/2021 1044   MCH 32.6 03/17/2019 2241   MCHC 34.3 03/04/2021 1044   RDW 12.6 03/04/2021 1044   LYMPHSABS 2.0 03/04/2021 1044   MONOABS 0.3 03/04/2021 1044   EOSABS 0.0 03/04/2021 1044   BASOSABS 0.0 03/04/2021 1044      Latest Ref Rng & Units 03/04/2021   10:44 AM 03/17/2019   10:41 PM  CMP  Glucose 70 - 99 mg/dL 86  118   BUN 6 - 23 mg/dL 10  5   Creatinine 0.40 - 1.20 mg/dL 0.65  0.71   Sodium 135 - 145 mEq/L 135  134   Potassium 3.5 - 5.1 mEq/L 4.0  3.5   Chloride 96 - 112 mEq/L 102  102   CO2 19 - 32 mEq/L 22  21   Calcium 8.4 - 10.5 mg/dL 9.5  9.2   Total Protein 6.0 - 8.3 g/dL 7.9  7.7   Total Bilirubin 0.2 - 1.2 mg/dL 0.4  0.7   Alkaline Phos 39 - 117 U/L 42  69   AST 0 - 37 U/L 15  108   ALT 0 - 35 U/L 10  106    03/04/21: TSH, B12 wnl   IMAGING:  none  Current Outpatient Medications on File Prior to Visit  Medication Sig Dispense Refill   pregabalin (LYRICA)  150 MG capsule Take 1 capsule (150 mg total) by mouth 2 (two) times daily. (Patient not taking: Reported on 09/02/2022) 60 capsule 3   No current facility-administered medications on file prior to visit.     Allergies: No Known Allergies  Family History: Migraine or other headaches in the family:  no Aneurysms in a first degree relative:  no Brain tumors in the family:  no Other neurological illness in the family:   no  Past Medical History: Past Medical History:  Diagnosis Date   Medical history non-contributory     Past Surgical History Past Surgical History:  Procedure Laterality Date   CESAREAN SECTION     HEMORROIDECTOMY      Social History: Social History   Tobacco Use   Smoking status: Never   Smokeless tobacco: Never  Substance Use Topics   Alcohol use: Never   Drug use: No    ROS: Negative for fevers, chills. Positive for headaches, neck pain. All other systems reviewed and negative unless stated otherwise in HPI.   Physical Exam:   Vital Signs: BP (!) 106/59    Pulse 62   Ht 5' 5" (1.651 m)   Wt 139 lb (63 kg)   BMI 23.13 kg/m  GENERAL: well appearing,in no acute distress,alert SKIN:  Color, texture, turgor normal. No rashes or lesions HEAD:  Normocephalic/atraumatic. CV:  RRR RESP: Normal respiratory effort MSK: +tenderness to palpation over left occiput, neck, and shoulder. Limited cervical range of motion turning head left>right  NEUROLOGICAL: Mental Status: Alert, oriented to person, place and time,Follows commands Cranial Nerves: PERRL, visual fields intact to confrontation, extraocular movements intact, facial sensation intact, no facial droop or ptosis, hearing grossly intact, no dysarthria, palate elevate symmetrically, tongue protrudes midline, shoulder shrug intact and symmetric Motor: mildly decreased grip strength on left, otherwise muscle strength 5/5 both upper and lower extremities Reflexes: 2+ throughout Sensation: intact to light touch all 4 extremities Coordination: Finger-to- nose-finger intact bilaterally Gait: normal-based   IMPRESSION: 51 year old female without significant medical history who presents for worsening left sided headaches and neck pain. Will order brain MRI as she is now reporting constant unilateral headaches. MRI C-spine ordered as she also reports radicular symptoms in her left upper extremity. Will check ESR/CRP as she reports scalp tenderness and blurred vision. Her headaches do have migrainous features including phonophobia and nausea. Will start amitriptyline for headache prevention, which may also help her radicular pain. Maxalt started for migraine rescue.  PLAN: -ESR, CRP -MRI brain and C-spine -Start amitriptyline 12.5 mg QHS x1 week, then increase to 25 mg QHS -Start Maxalt 10 mg PRN -Next steps: consider neck PT, SNRI, Topamax  I spent a total of 28 minutes chart reviewing and counseling the patient. Headache education was done. Discussed treatment options including preventive and acute  medications. Discussed medication side effects, adverse reactions and drug interactions. Written educational materials and patient instructions outlining all of the above were given.  Follow-up: 3 months   Jennifer Chima, MD 09/02/2022   11:48 AM   

## 2022-09-02 ENCOUNTER — Encounter: Payer: Self-pay | Admitting: Psychiatry

## 2022-09-02 ENCOUNTER — Ambulatory Visit: Payer: BC Managed Care – PPO | Admitting: Psychiatry

## 2022-09-02 VITALS — BP 106/59 | HR 62 | Ht 65.0 in | Wt 139.0 lb

## 2022-09-02 DIAGNOSIS — M5412 Radiculopathy, cervical region: Secondary | ICD-10-CM

## 2022-09-02 DIAGNOSIS — R519 Headache, unspecified: Secondary | ICD-10-CM

## 2022-09-02 MED ORDER — RIZATRIPTAN BENZOATE 10 MG PO TABS: 10.0000 mg | ORAL_TABLET | ORAL | 6 refills | Status: AC | PRN

## 2022-09-02 MED ORDER — AMITRIPTYLINE HCL 25 MG PO TABS: ORAL_TABLET | ORAL | 3 refills | Status: AC

## 2022-09-02 NOTE — Patient Instructions (Signed)
Plan: MRI brain and cervical spine Blood work to look for inflammation Start amitriptyline for headache prevention. Take 1/2 pill at bedtime for one week, then increase to 1 pill at bedtime Start rizatriptan as needed for severe headaches. Take 1 pill at onset of headache. May repeat a dose in 2 hours if headache persists. Max dose 2 pills in 24 hours. Please avoid taking more than 2 days in a week to avoid rebound headaches

## 2022-09-03 ENCOUNTER — Telehealth: Payer: Self-pay | Admitting: Psychiatry

## 2022-09-03 NOTE — Telephone Encounter (Signed)
Pt husband is calling. Stated someone called him to schedule MRI. He is requesting a call-back

## 2022-09-03 NOTE — Telephone Encounter (Signed)
Called husband, advised him Rodman Pickle called to schedule MRI. Ill send her the message to call Mon to schedule. He  verbalized understanding, appreciation.

## 2022-09-04 LAB — C-REACTIVE PROTEIN: CRP: <1 mg/L (ref 0–10)

## 2022-09-04 LAB — SEDIMENTATION RATE: Sed Rate: 5 mm/hr (ref 0–32)

## 2022-09-06 ENCOUNTER — Telehealth: Payer: Self-pay

## 2022-09-06 NOTE — Telephone Encounter (Signed)
Contacted pt, LVM per DPR, informing her results are normal. Number provided to CB with questions.

## 2022-09-06 NOTE — Telephone Encounter (Signed)
-----   Message from Genia Harold, MD sent at 09/06/2022 11:23 AM EDT ----- Blood work is normal

## 2022-09-06 NOTE — Telephone Encounter (Signed)
LVM asking pt to call back to schedule MRI 

## 2022-09-08 ENCOUNTER — Telehealth: Payer: Self-pay | Admitting: Psychiatry

## 2022-09-08 NOTE — Telephone Encounter (Signed)
Pt scheduled for 90 mins MRI brain w/wo contrast & MRI cervical spine wo contrast at [facility] for 09/14/22 at 10:00am.  [employer] auth# [id number] exp.09/02/2022-10/01/2022

## 2022-09-14 ENCOUNTER — Ambulatory Visit: Payer: BC Managed Care – PPO

## 2022-09-14 DIAGNOSIS — R519 Headache, unspecified: Secondary | ICD-10-CM

## 2022-09-14 DIAGNOSIS — M5412 Radiculopathy, cervical region: Secondary | ICD-10-CM

## 2022-09-14 MED ORDER — GADOBENATE DIMEGLUMINE 529 MG/ML IV SOLN
10.0000 mL | Freq: Once | INTRAVENOUS | Status: AC | PRN
  Administered 2022-09-14: 10 mL via INTRAVENOUS

## 2022-09-20 ENCOUNTER — Telehealth: Payer: Self-pay

## 2022-09-20 NOTE — Telephone Encounter (Signed)
Called pt and LVM discussed MRI results per DPR. Requested call back if any questions or concerns.

## 2022-09-20 NOTE — Telephone Encounter (Addendum)
-----   Message from Ocie Doyne, MD sent at 09/17/2022  8:13 AM EST ----- Brain MRI is normal  MRI C-spine shows mild disc bulging at C4-5 and C4-6, but it is very mild and likely not causing any symptoms. She may benefit from physical therapy for the neck to help with her neck pain. She can let me know if she's interested in this and I'll place a referral for her

## 2022-09-20 NOTE — Telephone Encounter (Signed)
Error

## 2024-09-10 DIAGNOSIS — H0288A Meibomian gland dysfunction right eye, upper and lower eyelids: Secondary | ICD-10-CM | POA: Diagnosis not present

## 2024-09-10 DIAGNOSIS — H0288B Meibomian gland dysfunction left eye, upper and lower eyelids: Secondary | ICD-10-CM | POA: Diagnosis not present

## 2024-09-10 DIAGNOSIS — H04123 Dry eye syndrome of bilateral lacrimal glands: Secondary | ICD-10-CM | POA: Diagnosis not present

## 2024-09-10 DIAGNOSIS — H02535 Eyelid retraction left lower eyelid: Secondary | ICD-10-CM | POA: Diagnosis not present
